# Patient Record
Sex: Male | Born: 1970 | Race: White | Hispanic: No | Marital: Married | Smoking: Never smoker
Health system: Southern US, Community
[De-identification: ages and names within clinical notes are randomized; demographics above are authoritative.]

## PROBLEM LIST (undated history)

## (undated) DIAGNOSIS — K8042 Calculus of bile duct with acute cholecystitis without obstruction: Secondary | ICD-10-CM

## (undated) DIAGNOSIS — R51 Headache: Secondary | ICD-10-CM

## (undated) DIAGNOSIS — K219 Gastro-esophageal reflux disease without esophagitis: Secondary | ICD-10-CM

## (undated) DIAGNOSIS — K859 Acute pancreatitis without necrosis or infection, unspecified: Secondary | ICD-10-CM

## (undated) DIAGNOSIS — G5620 Lesion of ulnar nerve, unspecified upper limb: Secondary | ICD-10-CM

## (undated) DIAGNOSIS — N200 Calculus of kidney: Secondary | ICD-10-CM

## (undated) DIAGNOSIS — R519 Headache, unspecified: Secondary | ICD-10-CM

## (undated) DIAGNOSIS — J45909 Unspecified asthma, uncomplicated: Secondary | ICD-10-CM

## (undated) DIAGNOSIS — K449 Diaphragmatic hernia without obstruction or gangrene: Secondary | ICD-10-CM

## (undated) HISTORY — PX: TONSILLECTOMY: SUR1361

## (undated) HISTORY — PX: HYDROCELE EXCISION: SHX482

## (undated) HISTORY — DX: Lesion of ulnar nerve, unspecified upper limb: G56.20

## (undated) HISTORY — DX: Calculus of bile duct with acute cholecystitis without obstruction: K80.42

## (undated) HISTORY — DX: Calculus of kidney: N20.0

## (undated) HISTORY — DX: Acute pancreatitis without necrosis or infection, unspecified: K85.90

## (undated) HISTORY — PX: INGUINAL HERNIA REPAIR: SUR1180

## (undated) SURGERY — LAPAROSCOPIC CHOLECYSTECTOMY
Anesthesia: General

---

## 2005-02-17 ENCOUNTER — Ambulatory Visit: Payer: Self-pay | Admitting: Internal Medicine

## 2005-03-01 ENCOUNTER — Ambulatory Visit: Payer: Self-pay | Admitting: Specialist

## 2007-06-19 ENCOUNTER — Ambulatory Visit: Payer: Self-pay | Admitting: Family Medicine

## 2007-06-19 DIAGNOSIS — J309 Allergic rhinitis, unspecified: Secondary | ICD-10-CM | POA: Insufficient documentation

## 2007-06-19 DIAGNOSIS — W57XXXA Bitten or stung by nonvenomous insect and other nonvenomous arthropods, initial encounter: Secondary | ICD-10-CM

## 2007-06-19 DIAGNOSIS — S30860A Insect bite (nonvenomous) of lower back and pelvis, initial encounter: Secondary | ICD-10-CM | POA: Insufficient documentation

## 2007-12-03 ENCOUNTER — Telehealth: Payer: Self-pay | Admitting: Internal Medicine

## 2008-06-10 ENCOUNTER — Telehealth: Payer: Self-pay | Admitting: Internal Medicine

## 2008-11-11 ENCOUNTER — Ambulatory Visit: Payer: Self-pay | Admitting: Family Medicine

## 2008-12-05 ENCOUNTER — Ambulatory Visit: Payer: Self-pay | Admitting: Unknown Physician Specialty

## 2009-08-21 ENCOUNTER — Ambulatory Visit: Payer: Self-pay | Admitting: Internal Medicine

## 2009-08-21 DIAGNOSIS — G5620 Lesion of ulnar nerve, unspecified upper limb: Secondary | ICD-10-CM

## 2009-08-21 DIAGNOSIS — R5383 Other fatigue: Secondary | ICD-10-CM

## 2009-08-21 DIAGNOSIS — R079 Chest pain, unspecified: Secondary | ICD-10-CM | POA: Insufficient documentation

## 2009-08-21 DIAGNOSIS — R5381 Other malaise: Secondary | ICD-10-CM | POA: Insufficient documentation

## 2009-08-21 HISTORY — DX: Lesion of ulnar nerve, unspecified upper limb: G56.20

## 2009-08-26 LAB — CONVERTED CEMR LAB
ALT: 64 units/L — ABNORMAL HIGH (ref 0–53)
AST: 40 units/L — ABNORMAL HIGH (ref 0–37)
Albumin: 4.7 g/dL (ref 3.5–5.2)
Alkaline Phosphatase: 102 units/L (ref 39–117)
BUN: 13 mg/dL (ref 6–23)
Basophils Absolute: 0 10*3/uL (ref 0.0–0.1)
Basophils Relative: 0 % (ref 0–1)
CO2: 26 meq/L (ref 19–32)
Calcium: 9.6 mg/dL (ref 8.4–10.5)
Chloride: 100 meq/L (ref 96–112)
Creatinine, Ser: 0.96 mg/dL (ref 0.40–1.50)
Eosinophils Absolute: 0.1 10*3/uL (ref 0.0–0.7)
Eosinophils Relative: 1 % (ref 0–5)
Glucose, Bld: 97 mg/dL (ref 70–99)
HCT: 43 % (ref 39.0–52.0)
Hemoglobin: 14.7 g/dL (ref 13.0–17.0)
Lymphocytes Relative: 25 % (ref 12–46)
Lymphs Abs: 2 10*3/uL (ref 0.7–4.0)
MCHC: 34.2 g/dL (ref 30.0–36.0)
MCV: 90.9 fL (ref 78.0–100.0)
Monocytes Absolute: 0.6 10*3/uL (ref 0.1–1.0)
Monocytes Relative: 8 % (ref 3–12)
Neutro Abs: 5.5 10*3/uL (ref 1.7–7.7)
Neutrophils Relative %: 67 % (ref 43–77)
Platelets: 206 10*3/uL (ref 150–400)
Potassium: 3.9 meq/L (ref 3.5–5.3)
RBC: 4.73 M/uL (ref 4.22–5.81)
RDW: 12 % (ref 11.5–15.5)
Sodium: 141 meq/L (ref 135–145)
TSH: 1.341 microintl units/mL (ref 0.350–4.500)
Total Bilirubin: 0.4 mg/dL (ref 0.3–1.2)
Total Protein: 7.5 g/dL (ref 6.0–8.3)
Vit D, 25-Hydroxy: 37 ng/mL (ref 30–89)
WBC: 8.2 10*3/uL (ref 4.0–10.5)

## 2010-03-16 NOTE — Assessment & Plan Note (Signed)
Summary: ? LYMES DISEASE   Vital Signs:  Patient profile:   40 year old male Weight:      158 pounds Temp:     98.5 degrees F oral Pulse rate:   84 / minute Pulse rhythm:   regular BP sitting:   132 / 100  (right arm) Cuff size:   regular  Vitals Entered By: Lowella Petties CMA (August 21, 2009 3:10 PM) CC: Tingling left arm, ? lyme's disease   History of Present Illness: "I am feeling a littel pitiful"  Has had stiff neck, dull headache over the past 2 weeks Tingling numbness along left arm in ulnar distribution  vague chest discomfort gets intermittent tight feeling but no serious intensity  No known tick bite but outside a lot No fevers no rash  Allergies: 1)  ! * Advair  Past History:  Past medical, surgical, family and social histories (including risk factors) reviewed for relevance to current acute and chronic problems.  Past Medical History: Reviewed history from 06/20/2007 and no changes required. Unremarkable  Past Surgical History: Reviewed history from 06/20/2007 and no changes required. Atypical pneumonia (1990) Hydrocele (2001) Hernias as a baby  Family History: Reviewed history from 06/20/2007 and no changes required. Father okay Mother with Altzheimer's Siblings: 1 brother No CAD, HTN, DM Breast cancer in pat GGM Dennie Bible GGF died with laryngeal cancer No prostate or colon cancer  Social History: Reviewed history from 06/20/2007 and no changes required. Marital Status: Married Children: 1 son Occupation: Teacher, early years/pre Never Smoked Alcohol use-occ  Review of Systems       eating okay weight is stable no other joint issues  Physical Exam  General:  alert.  NAD Head:  normocephalic and atraumatic.   Neck:  supple, full ROM, no masses, no thyromegaly, and no cervical lymphadenopathy.   Lungs:  normal respiratory effort and normal breath sounds.   Heart:  normal rate, regular rhythm, no murmur, and no gallop.   Abdomen:  soft, non-tender,  no hepatomegaly, and no splenomegaly.   Msk:  no joint tenderness and no joint swelling.   Extremities:  no edema Neurologic:  alert & oriented X3, strength normal in all extremities, and gait normal.   Skin:  no rashes and no suspicious lesions.   Axillary Nodes:  No palpable lymphadenopathy Inguinal Nodes:  No significant adenopathy Psych:  normally interactive, good eye contact, not anxious appearing, and not depressed appearing.     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 Laboratory test [phone]) Venipuncture [phone]) Specimen Handling (66440)  Problem # 3:  CHEST PAIN (ICD-786.50) Assessment: New  very nonspecific  Orders: EKG w/ Interpretation (93000)  Complete Medication List: 1)  Zyrtec Allergy 10 Mg Tabs (Cetirizine hcl) .... Take 1 tablet by mouth once a day as needed 2)  Allegra 180 Mg Tabs (Fexofenadine hcl) .... Take 1 tablet by mouth once a day  as needed 3)  Dayquil Multi-symptom 30-325-10 Mg/44ml Liqd (Pseudoephedrine-apap-dm) .... As needed 4)  Elocon 0.1 % Crea (Mometasone furoate) .... Apply thinnly two times a day to rash 5)  Fluticasone Propionate 50 Mcg/act Susp (Fluticasone propionate) .... 2 sprays each  nostril daily 6)  Alprazolam 0.25 Mg Tabs (Alprazolam) .Marland Kitchen.. 1 to 2 tablets three times a day prn 7)  Ventolin Hfa 108 (90 Base) Mcg/act Aers (Albuterol sulfate) .... 2 puffs four times daily as needed for wheezing 8)  Astepro 0.15 % Soln (Azelastine hcl) .... 2 sprays two times a day fo rallergic rhinitis 9)  Prilosec 40 Mg Cpdr (Omeprazole) .... Take one  by mouth daily  Patient Instructions: 1)  Please schedule a follow-up appointment as needed .   Prior Medications (reviewed today): ZYRTEC ALLERGY 10 MG  TABS (CETIRIZINE HCL) Take 1 tablet by mouth once a day as needed ALLEGRA 180 MG  TABS (FEXOFENADINE HCL) Take 1 tablet by mouth once a day  as needed DAYQUIL MULTI-SYMPTOM 30-325-10 MG/15ML  LIQD (PSEUDOEPHEDRINE-APAP-DM) as needed ELOCON 0.1 %  CREA (MOMETASONE FUROATE) apply thinnly two times a day to rash FLUTICASONE PROPIONATE 50 MCG/ACT SUSP (FLUTICASONE PROPIONATE) 2 sprays each nostril daily ALPRAZOLAM 0.25 MG TABS (ALPRAZOLAM) 1 to 2 tablets three times a day prn VENTOLIN HFA 108 (90 BASE) MCG/ACT AERS (ALBUTEROL SULFATE) 2 puffs four times daily as needed for wheezing ASTEPRO 0.15 % SOLN (AZELASTINE HCL) 2 sprays two times a day fo rallergic rhinitis PRILOSEC 40 MG CPDR (OMEPRAZOLE) take one by mouth daily Current Allergies: ! * ADVAIR   EKG  Procedure date:  08/21/2009  Findings:      sinus @90  short PR but no delta waves otherwise normal

## 2011-07-19 ENCOUNTER — Other Ambulatory Visit: Payer: Self-pay | Admitting: *Deleted

## 2011-07-19 MED ORDER — ALBUTEROL SULFATE HFA 108 (90 BASE) MCG/ACT IN AERS: 2.0000 | INHALATION_SPRAY | Freq: Four times a day (QID) | RESPIRATORY_TRACT | Status: DC | PRN

## 2011-07-19 NOTE — Telephone Encounter (Signed)
rx sent to pharmacy by e-script .left message to have patient schedule CPX.

## 2011-07-19 NOTE — Telephone Encounter (Signed)
Okay #1 x 1 Let him know he should set up for a physical within the next year

## 2011-07-19 NOTE — Telephone Encounter (Signed)
rx not on med list, pt last seen 2011, no future appts scheduled, ok to fill?

## 2012-01-27 ENCOUNTER — Other Ambulatory Visit: Payer: Self-pay | Admitting: *Deleted

## 2012-01-27 MED ORDER — ALBUTEROL SULFATE HFA 108 (90 BASE) MCG/ACT IN AERS: 2.0000 | INHALATION_SPRAY | Freq: Four times a day (QID) | RESPIRATORY_TRACT | Status: DC | PRN

## 2012-01-27 NOTE — Telephone Encounter (Signed)
rx sent to pharmacy by e-script  

## 2012-01-27 NOTE — Telephone Encounter (Signed)
Okay to refill #1 x 1

## 2012-01-27 NOTE — Telephone Encounter (Signed)
Pt faxed request for his refill (pharmacist at [facility]) per fax he says he's not having any problems or concerns. He's just trying to use up flex spending money for the year and request refill to do so. Is it ok to refill, he does not have any future appointments scheduled.

## 2013-10-09 ENCOUNTER — Other Ambulatory Visit: Payer: Self-pay | Admitting: Internal Medicine

## 2013-10-09 NOTE — Telephone Encounter (Signed)
See note attached to refill request from the pharmacist

## 2013-10-10 MED ORDER — ALBUTEROL SULFATE 108 (90 BASE) MCG/ACT IN AEPB: 2.0000 | INHALATION_SPRAY | Freq: Four times a day (QID) | RESPIRATORY_TRACT | Status: DC | PRN

## 2013-10-10 NOTE — Telephone Encounter (Signed)
rx sent to pharmacy by e-script rx changed

## 2013-10-10 NOTE — Telephone Encounter (Signed)
Okay to try the respiclick with the coupons

## 2015-02-11 ENCOUNTER — Encounter: Payer: Self-pay | Admitting: Family Medicine

## 2015-02-11 ENCOUNTER — Ambulatory Visit (INDEPENDENT_AMBULATORY_CARE_PROVIDER_SITE_OTHER): Payer: Managed Care, Other (non HMO) | Admitting: Family Medicine

## 2015-02-11 VITALS — BP 120/80 | HR 99 | Temp 98.3°F | Ht 68.0 in | Wt 154.0 lb

## 2015-02-11 DIAGNOSIS — L729 Follicular cyst of the skin and subcutaneous tissue, unspecified: Secondary | ICD-10-CM | POA: Diagnosis not present

## 2015-02-11 DIAGNOSIS — L089 Local infection of the skin and subcutaneous tissue, unspecified: Secondary | ICD-10-CM | POA: Diagnosis not present

## 2015-02-11 MED ORDER — SULFAMETHOXAZOLE-TRIMETHOPRIM 800-160 MG PO TABS: 2.0000 | ORAL_TABLET | Freq: Two times a day (BID) | ORAL | Status: DC

## 2015-02-11 NOTE — Progress Notes (Signed)
Pre visit review using our clinic review tool, if applicable. No additional management support is needed unless otherwise documented below in the visit note. 

## 2015-02-11 NOTE — Progress Notes (Signed)
Subjective:    Patient ID: Warren David, male    DOB: 06/04/1970, 44 y.o.   MRN: [id number]  HPI Here with an ingrown hair/ infected on his neck   Worse since 12/25 Put bactroban on it  Started keflex - QID - 500 mg 2 days worth   Is draining - he popped it yesterday   No fever  Feels warm    Works in a pharmacy   Patient Active Problem List   Diagnosis Date Noted  . 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 FATIGUE 08/21/2009  . CHEST PAIN 08/21/2009  . ALLERGIC RHINITIS 06/19/2007  . INSECT BITE, TRUNK 06/19/2007   No past medical history on file. No past surgical history on file. Social History  Substance Use Topics  . Smoking status: Never Smoker   . Smokeless tobacco: None  . Alcohol Use: None   No family history on file. Allergies  Allergen Reactions  . Fluticasone-Salmeterol     REACTION: palpatations   Current Outpatient Prescriptions on File Prior to Visit  Medication Sig Dispense Refill  . Albuterol Sulfate (PROAIR RESPICLICK) 108 (90 BASE) MCG/ACT AEPB Inhale 2 puffs into the lungs 4 (four) times daily as needed. 1 each 1   No current facility-administered medications on file prior to visit.    Review of Systems Review of Systems  Constitutional: Negative for fever, appetite change, fatigue and unexpected weight change.  Eyes: Negative for pain and visual disturbance.  Respiratory: Negative for cough and shortness of breath.   Cardiovascular: Negative for cp or palpitations    Gastrointestinal: Negative for nausea, diarrhea and constipation.  Genitourinary: Negative for urgency and frequency.  Skin: Negative for pallor or rash  pos for infected cyst on face  Neurological: Negative for weakness, light-headedness, numbness and headaches.  Hematological: Negative for adenopathy. Does not bruise/bleed easily.  Psychiatric/Behavioral: Negative for dysphoric mood. The patient is not nervous/anxious.         Objective:   Physical Exam    Constitutional: He appears well-developed and well-nourished. No distress.  Well appearing   HENT:  Head: Normocephalic and atraumatic.  Right Ear: External ear normal.  Left Ear: External ear normal.  Nose: Nose normal.  Mouth/Throat: Oropharynx is clear and moist. No oropharyngeal exudate.  Eyes: Conjunctivae and EOM are normal. Pupils are equal, round, and reactive to light.  Neck: Normal range of motion. Neck supple.  Cardiovascular: Normal rate and regular rhythm.   Skin: Skin is warm and dry. There is erythema.  2 by 3 cm area of erythema and induration (firm) over R mandible area (in beard) Several small holes with clear d/c (spec taken for cx but unable to express pus) Mildly tender Warm to the touch  No streaking   Psychiatric: He has a normal mood and affect.          Assessment & Plan:   Problem List Items Addressed This Visit      Musculoskeletal and Integument   Infected cyst of skin - Primary    On R jaw-likely began as an ingrown hair Not imp after 2d of keflex Still to firm to I and D- but has had some spontaneous drainage at home  Wound cx sent from small amt of material expressed (clear) Concern for MRSA  Will begin bactrim 2 po bid for coverage Recommend warm compresses  Clean with antibacterial soap and water and cover lightly Update if not starting to improve in a week or if worsening

## 2015-02-11 NOTE — Patient Instructions (Signed)
Because keflex is not working - your infection may be MRSA Continue bactroban  Cover lightly  Take the bactrim as directed  Use warm compresses every chance you get  Encourage drainage but do not over manipulate it  Clean with antibacterial soap and water  If you develop red streaks or fever - alert us right away  Do not shave until this is completely better  I am sending a wound culture and we will update you with results

## 2015-02-12 NOTE — Assessment & Plan Note (Signed)
On R jaw-likely began as an ingrown hair Not imp after 2d of keflex Still to firm to I and D- but has had some spontaneous drainage at home  Wound cx sent from small amt of material expressed (clear) Concern for MRSA  Will begin bactrim 2 po bid for coverage Recommend warm compresses  Clean with antibacterial soap and water and cover lightly Update if not starting to improve in a week or if worsening

## 2015-02-14 LAB — WOUND CULTURE
GRAM STAIN: NONE SEEN
GRAM STAIN: NONE SEEN

## 2015-12-28 ENCOUNTER — Other Ambulatory Visit: Payer: Self-pay | Admitting: Unknown Physician Specialty

## 2015-12-28 DIAGNOSIS — R1310 Dysphagia, unspecified: Secondary | ICD-10-CM

## 2015-12-28 DIAGNOSIS — K219 Gastro-esophageal reflux disease without esophagitis: Secondary | ICD-10-CM

## 2016-01-01 ENCOUNTER — Ambulatory Visit
Admission: RE | Admit: 2016-01-01 | Discharge: 2016-01-01 | Disposition: A | Discharge status: Home / Self Care | Payer: PRIVATE HEALTH INSURANCE | Source: Ambulatory Visit | Attending: Unknown Physician Specialty | Admitting: Unknown Physician Specialty

## 2016-01-01 DIAGNOSIS — K219 Gastro-esophageal reflux disease without esophagitis: Secondary | ICD-10-CM | POA: Insufficient documentation

## 2016-01-01 DIAGNOSIS — R1310 Dysphagia, unspecified: Secondary | ICD-10-CM | POA: Insufficient documentation

## 2016-01-05 ENCOUNTER — Telehealth: Payer: Self-pay | Admitting: Gastroenterology

## 2016-01-05 NOTE — Telephone Encounter (Signed)
Spoke to patient. 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 Please call

## 2016-01-05 NOTE — Telephone Encounter (Signed)
Please get his insurance information and put it in.

## 2016-01-20 ENCOUNTER — Other Ambulatory Visit: Payer: Self-pay

## 2016-01-20 NOTE — Telephone Encounter (Signed)
Gastroenterology Pre-Procedure Review  Request Date: 02/18/2016 Requesting Physician:   PATIENT REVIEW QUESTIONS: The patient responded to the following health history questions as indicated:    1. Are you having any GI issues? no 2. Do you have a personal history of Polyps? no 3. Do you have a family history of Colon Cancer or Polyps? yes (aunt colon cancer) 4. Diabetes Mellitus? no 5. Joint replacements in the past 12 months?no 6. Major health problems in the past 3 months?no 7. Any artificial heart valves, MVP, or defibrillator?no    MEDICATIONS & ALLERGIES:    Patient reports the following regarding taking any anticoagulation/antiplatelet therapy:   Plavix, Coumadin, Eliquis, Xarelto, Lovenox, Pradaxa, Brilinta, or Effient? no Aspirin? no  Patient confirms/reports the following medications:  Current Outpatient Prescriptions  Medication Sig Dispense Refill  . Albuterol Sulfate (PROAIR RESPICLICK) 108 (90 BASE) MCG/ACT AEPB Inhale 2 puffs into the lungs 4 (four) times daily as needed. 1 each 1  . fluticasone (FLONASE) 50 MCG/ACT nasal spray Place into both nostrils daily.    Marland Kitchen. omeprazole (PRILOSEC) 40 MG capsule Take 40 mg by mouth daily.     No current facility-administered medications for this visit.     Patient confirms/reports the following allergies:  Allergies  Allergen Reactions  . Fluticasone-Salmeterol     REACTION: palpatations    No orders of the defined types were placed in this encounter.   AUTHORIZATION INFORMATION Primary Insurance: 1D#: Group #:  Secondary Insurance: 1D#: Group #:  SCHEDULE INFORMATION: Date: 02/18/2016 Time: Location: [facility] \

## 2016-01-20 NOTE — Telephone Encounter (Signed)
EGD GERD K21.9, [facility]13.14 [facility] Dr. Servando SnareWohl [employer] Call to pre cert and see if we are in net-work

## 2016-01-26 NOTE — Telephone Encounter (Signed)
We are in network, an authorization is not required

## 2016-01-29 ENCOUNTER — Other Ambulatory Visit: Payer: Self-pay

## 2016-02-18 ENCOUNTER — Ambulatory Visit: Admit: 2016-02-18 | Payer: Managed Care, Other (non HMO) | Admitting: Gastroenterology

## 2016-02-18 SURGERY — ESOPHAGOGASTRODUODENOSCOPY (EGD) WITH PROPOFOL
Anesthesia: General

## 2016-03-01 ENCOUNTER — Ambulatory Visit: Payer: No Typology Code available for payment source | Admitting: Registered Nurse

## 2016-03-01 ENCOUNTER — Ambulatory Visit
Admission: RE | Admit: 2016-03-01 | Discharge: 2016-03-01 | Disposition: A | Discharge status: Home / Self Care | Payer: No Typology Code available for payment source | Source: Ambulatory Visit | Attending: Gastroenterology | Admitting: Gastroenterology

## 2016-03-01 ENCOUNTER — Encounter
Admission: RE | Disposition: A | Discharge status: Home / Self Care | Payer: Self-pay | Source: Ambulatory Visit | Attending: Gastroenterology

## 2016-03-01 DIAGNOSIS — K449 Diaphragmatic hernia without obstruction or gangrene: Secondary | ICD-10-CM | POA: Insufficient documentation

## 2016-03-01 DIAGNOSIS — R1310 Dysphagia, unspecified: Secondary | ICD-10-CM

## 2016-03-01 DIAGNOSIS — K219 Gastro-esophageal reflux disease without esophagitis: Secondary | ICD-10-CM | POA: Diagnosis not present

## 2016-03-01 DIAGNOSIS — J45909 Unspecified asthma, uncomplicated: Secondary | ICD-10-CM | POA: Diagnosis not present

## 2016-03-01 HISTORY — PX: ESOPHAGOGASTRODUODENOSCOPY (EGD) WITH PROPOFOL: SHX5813

## 2016-03-01 SURGERY — ESOPHAGOGASTRODUODENOSCOPY (EGD) WITH PROPOFOL
Anesthesia: General

## 2016-03-01 MED ORDER — LIDOCAINE HCL (PF) 1 % IJ SOLN
INTRAMUSCULAR | Status: AC
  Administered 2016-03-01: 0.03 mL via INTRADERMAL
  Filled 2016-03-01: qty 2

## 2016-03-01 MED ORDER — LIDOCAINE HCL (PF) 2 % IJ SOLN
INTRAMUSCULAR | Status: DC | PRN
  Administered 2016-03-01: 80 mg via INTRADERMAL

## 2016-03-01 MED ORDER — SODIUM CHLORIDE 0.9 % IV SOLN
INTRAVENOUS | Status: DC
  Administered 2016-03-01: 1000 mL via INTRAVENOUS

## 2016-03-01 MED ORDER — FENTANYL CITRATE (PF) 100 MCG/2ML IJ SOLN
INTRAMUSCULAR | Status: DC | PRN
  Administered 2016-03-01: 150 ug via INTRAVENOUS

## 2016-03-01 MED ORDER — PROPOFOL 500 MG/50ML IV EMUL
INTRAVENOUS | Status: AC
  Filled 2016-03-01: qty 50

## 2016-03-01 MED ORDER — IPRATROPIUM-ALBUTEROL 0.5-2.5 (3) MG/3ML IN SOLN
3.0000 mL | Freq: Once | RESPIRATORY_TRACT | Status: AC
  Administered 2016-03-01: 3 mL via RESPIRATORY_TRACT

## 2016-03-01 MED ORDER — PROPOFOL 10 MG/ML IV BOLUS
INTRAVENOUS | Status: DC | PRN
  Administered 2016-03-01: 80 mg via INTRAVENOUS

## 2016-03-01 MED ORDER — LIDOCAINE HCL 2 % EX GEL
CUTANEOUS | Status: AC
  Filled 2016-03-01: qty 5

## 2016-03-01 MED ORDER — IPRATROPIUM-ALBUTEROL 0.5-2.5 (3) MG/3ML IN SOLN
RESPIRATORY_TRACT | Status: AC
  Administered 2016-03-01: 3 mL via RESPIRATORY_TRACT
  Filled 2016-03-01: qty 3

## 2016-03-01 MED ORDER — LIDOCAINE HCL (PF) 1 % IJ SOLN
2.0000 mL | Freq: Once | INTRAMUSCULAR | Status: AC
  Administered 2016-03-01: 0.03 mL via INTRADERMAL

## 2016-03-01 MED ORDER — PROPOFOL 500 MG/50ML IV EMUL
INTRAVENOUS | Status: DC | PRN
  Administered 2016-03-01: 150 ug/kg/min via INTRAVENOUS

## 2016-03-01 NOTE — H&P (Signed)
  Warren Miniumarren Dorotea Hand, MD [facility] [address] [place], [place] [address] Phone: [phone] Fax : [phone]  Primary Care Physician:  Tillman Abideichard Letvak, MD Primary Gastroenterologist:  Dr. Servando SnareWohl  Pre-Procedure History & Physical: HPI:  Warren David is a 46 y.o. male is here for an endoscopy.   No past medical history on file.  No past surgical history on file.  Prior to Admission medications   Medication Sig Start Date End Date Taking? Authorizing Provider  Albuterol Sulfate (PROAIR RESPICLICK) 108 (90 BASE) MCG/ACT AEPB Inhale 2 puffs into the lungs 4 (four) times daily as needed. 10/10/13   Karie Schwalbeichard I Letvak, MD  fluticasone (FLONASE) 50 MCG/ACT nasal spray Place into both nostrils daily.    Historical Provider, MD  omeprazole (PRILOSEC) 40 MG capsule Take 40 mg by mouth daily.    Historical Provider, MD    Allergies as of 01/29/2016 - Review Complete 01/20/2016  Allergen Reaction Noted  . Fluticasone-salmeterol      No family history on file.  Social History   Social History  . Marital status: Married    Spouse name: N/A  . Number of children: N/A  . Years of education: N/A   Occupational History  . Not on file.   Social History Main Topics  . Smoking status: Never Smoker  . Smokeless tobacco: Not on file  . Alcohol use Not on file  . Drug use: No  . Sexual activity: Not on file   Other Topics Concern  . Not on file   Social History Narrative  . No narrative on file    Review of Systems: See HPI, otherwise negative ROS  Physical Exam: BP 140/77   Pulse 95   Temp (!) 96.8 F (36 C) (Tympanic)   Resp 17   Ht 5\' 8"  (1.727 m)   Wt 155 lb (70.3 kg)   SpO2 99%   BMI 23.57 kg/m  General:   Alert,  pleasant and cooperative in NAD Head:  Normocephalic and atraumatic. Neck:  Supple; no masses or thyromegaly. Lungs:  Clear throughout to auscultation.    Heart:  Regular rate and rhythm. Abdomen:  Soft, nontender and nondistended. Normal bowel  sounds, without guarding, and without rebound.   Neurologic:  Alert and  oriented x4;  grossly normal neurologically.  Impression/Plan: Warren David is here for an endoscopy to be performed for dysphagia  Risks, benefits, limitations, and alternatives regarding  endoscopy have been reviewed with the patient.  Questions have been answered.  All parties agreeable.   Warren Miniumarren Areil Ottey, MD  03/01/2016, 7:40 AM

## 2016-03-01 NOTE — Anesthesia Postprocedure Evaluation (Signed)
Anesthesia Post Note  Patient: Warren David  Procedure(s) Performed: Procedure(s) (LRB): ESOPHAGOGASTRODUODENOSCOPY (EGD) WITH PROPOFOL (N/A)  Patient location during evaluation: Endoscopy Anesthesia Type: General Level of consciousness: awake and alert Pain management: pain level controlled Vital Signs Assessment: post-procedure vital signs reviewed and stable Respiratory status: spontaneous breathing, nonlabored ventilation, respiratory function stable and patient connected to nasal cannula oxygen Cardiovascular status: blood pressure returned to baseline and stable Postop Assessment: no signs of nausea or vomiting Anesthetic complications: no     Last Vitals:  Vitals:   03/01/16 0811 03/01/16 0820  BP: 102/68 114/75  Pulse: 91 91  Resp: 15 19  Temp: (!) 35.6 C     Last Pain:  Vitals:   03/01/16 0811  TempSrc: Tympanic                 Cleda MccreedyJoseph K Piscitello

## 2016-03-01 NOTE — Anesthesia Preprocedure Evaluation (Signed)
Anesthesia Evaluation  Patient identified by MRN, date of birth, ID band Patient awake    Reviewed: Allergy & Precautions, H&P , NPO status , Patient's Chart, lab work & pertinent test results  History of Anesthesia Complications Negative for: history of anesthetic complications  Airway Mallampati: III  TM Distance: <3 FB Neck ROM: full    Dental  (+) Poor Dentition, Chipped   Pulmonary neg shortness of breath, asthma ,    Pulmonary exam normal breath sounds clear to auscultation       Cardiovascular Exercise Tolerance: Good (-) angina(-) Past MI and (-) DOE negative cardio ROS Normal cardiovascular exam Rhythm:regular Rate:Normal     Neuro/Psych  Neuromuscular disease negative psych ROS   GI/Hepatic Neg liver ROS, GERD  Controlled and Medicated,  Endo/Other  negative endocrine ROS  Renal/GU negative Renal ROS  negative genitourinary   Musculoskeletal   Abdominal   Peds  Hematology negative hematology ROS (+)   Anesthesia Other Findings No past medical history on file.  No past surgical history on file.  BMI    Body Mass Index:  23.57 kg/m      Reproductive/Obstetrics negative OB ROS                             Anesthesia Physical Anesthesia Plan  ASA: III  Anesthesia Plan: General   Post-op Pain Management:    Induction:   Airway Management Planned:   Additional Equipment:   Intra-op Plan:   Post-operative Plan:   Informed Consent: I have reviewed the patients History and Physical, chart, labs and discussed the procedure including the risks, benefits and alternatives for the proposed anesthesia with the patient or authorized representative who has indicated his/her understanding and acceptance.   Dental Advisory Given  Plan Discussed with: Anesthesiologist, CRNA and Surgeon  Anesthesia Plan Comments:         Anesthesia Quick Evaluation

## 2016-03-01 NOTE — Transfer of Care (Signed)
Immediate Anesthesia Transfer of Care Note  Patient: Warren David IV  Procedure(s) Performed: Procedure(s): ESOPHAGOGASTRODUODENOSCOPY (EGD) WITH PROPOFOL (N/A)  Patient Location: PACU  Anesthesia Type:General  Level of Consciousness: awake, alert  and oriented  Airway & Oxygen Therapy: Patient Spontanous Breathing and Patient connected to nasal cannula oxygen  Post-op Assessment: Report given to RN and Post -op Vital signs reviewed and stable  Post vital signs: Reviewed and stable  Last Vitals:  Vitals:   03/01/16 0810 03/01/16 0811  BP: 102/68 102/68  Pulse: 90 91  Resp: 11 15  Temp: (!) 35.6 C (!) 35.6 C    Last Pain:  Vitals:   03/01/16 0811  TempSrc: Tympanic         Complications: No apparent anesthesia complications

## 2016-03-01 NOTE — Anesthesia Procedure Notes (Signed)
Date/Time: 03/01/2016 7:55 AM Performed by: Karoline CaldwellSTARR, Edwardine Deschepper Pre-anesthesia Checklist: Patient identified, Emergency Drugs available, Suction available and Patient being monitored Patient Re-evaluated:Patient Re-evaluated prior to inductionOxygen Delivery Method: Nasal cannula

## 2016-03-01 NOTE — Op Note (Signed)
[facility] Patient Name: Warren David Procedure Date: 03/01/2016 7:55 AM MRN: [id number] Account #: [id number] Date of Birth: 07/16/1970 Admit Type: Outpatient Age: 46 [facility] Gender: Male Note Status: Finalized Procedure:            Upper GI endoscopy Indications:          Dysphagia Providers:            Midge Miniumarren Sila Sarsfield MD, MD Referring MD:         Karie Schwalbeichard I. Letvak (Referring MD) Medicines:            Propofol per Anesthesia Complications:        No immediate complications. Procedure:            Pre-Anesthesia Assessment:                       - Prior to the procedure, a History and Physical was                        performed, and patient medications and allergies were                        reviewed. The patient's tolerance of previous                        anesthesia was also reviewed. The risks and benefits of                        the procedure and the sedation options and risks were                        discussed with the patient. All questions were                        answered, and informed consent was obtained. Prior                        Anticoagulants: The patient has taken no previous                        anticoagulant or antiplatelet agents. ASA Grade                        Assessment: II - A patient with mild systemic disease.                        After reviewing the risks and benefits, the patient was                        deemed in satisfactory condition to undergo the                        procedure.                       After obtaining informed consent, the endoscope was                        passed under direct vision. Throughout the procedure,  the patient's blood pressure, pulse, and oxygen                        saturations were monitored continuously. The                        Colonoscope was introduced through the mouth, and                        advanced to the second  part of duodenum. The upper GI                        endoscopy was accomplished without difficulty. The                        patient tolerated the procedure well. Findings:      A small hiatal hernia was present.      Two biopsies were obtained with cold forceps for histology randomly in       the middle third of the esophagus.      The stomach was normal.      The examined duodenum was normal. Impression:           - Small hiatal hernia.                       - Normal stomach.                       - Normal examined duodenum.                       - Two biopsies were obtained in the middle third of the                        esophagus. Recommendation:       - Discharge patient to home.                       - Resume previous diet.                       - Continue present medications.                       - Await pathology results. Procedure Code(s):    --- Professional ---                       [phone], Esophagogastroduodenoscopy, flexible, transoral;                        with biopsy, single or multiple Diagnosis Code(s):    --- Professional ---                       R13.10, Dysphagia, unspecified CPT copyright 2016 American Medical Association. All rights reserved. The codes documented in this report are preliminary and upon coder review may  be revised to meet current compliance requirements. Midge Minium MD, MD 03/01/2016 8:07:31 AM This report has been signed electronically. Number of Addenda: 0 Note Initiated On: 03/01/2016 7:55 AM      [facility]

## 2016-03-02 LAB — SURGICAL PATHOLOGY

## 2016-03-04 ENCOUNTER — Encounter: Payer: Self-pay | Admitting: Gastroenterology

## 2016-03-06 ENCOUNTER — Encounter: Payer: Self-pay | Admitting: Gastroenterology

## 2016-11-02 ENCOUNTER — Ambulatory Visit (INDEPENDENT_AMBULATORY_CARE_PROVIDER_SITE_OTHER): Payer: PRIVATE HEALTH INSURANCE | Admitting: Family Medicine

## 2016-11-02 ENCOUNTER — Encounter: Payer: Self-pay | Admitting: Family Medicine

## 2016-11-02 DIAGNOSIS — L0211 Cutaneous abscess of neck: Secondary | ICD-10-CM | POA: Diagnosis not present

## 2016-11-02 DIAGNOSIS — R69 Illness, unspecified: Secondary | ICD-10-CM

## 2016-11-02 DIAGNOSIS — J111 Influenza due to unidentified influenza virus with other respiratory manifestations: Secondary | ICD-10-CM

## 2016-11-02 MED ORDER — OSELTAMIVIR PHOSPHATE 75 MG PO CAPS: 75.0000 mg | ORAL_CAPSULE | Freq: Two times a day (BID) | ORAL | 0 refills | Status: DC

## 2016-11-02 MED ORDER — DOXYCYCLINE HYCLATE 100 MG PO TABS: 100.0000 mg | ORAL_TABLET | Freq: Two times a day (BID) | ORAL | 0 refills | Status: DC

## 2016-11-02 NOTE — Progress Notes (Signed)
Subjective:   Patient ID: Warren David, male    DOB: [birth year], 46 y.o.   MRN: [id number]  Warren David is a pleasant 46 y.o. year old male who presents to clinic today with Acute Visit (Pt is here today c/o that he fell on monday on his back and has body soreness, he has body chills and his body is still aching, and his neck is pain, he is unsure if he has the flu or body hurts from the fall, he did have a low grade temp last night, slight cough, dry throat )  on 11/02/2016  HPI:  Warren David is a 46 y.o. male who present complaining of flu-like symptoms: fevers, chills, myalgias, congestion, sore throat and cough for 1 day. Denies dyspnea or wheezing.   Of note, he did slip in the rain and fall on his his back on Monday so he is not sure if some of his myalgias are related to that.  Also has "ingrown hair" on right side of his neck that is now red, very tender and draining pus.  He has had these before.  Has chills, nausea. Did vomit last night.  Current Outpatient Prescriptions on File Prior to Visit  Medication Sig Dispense Refill  . Albuterol Sulfate (PROAIR RESPICLICK) 108 (90 BASE) MCG/ACT AEPB Inhale 2 puffs into the lungs 4 (four) times daily as needed. 1 each 1  . fluticasone (FLONASE) 50 MCG/ACT nasal spray Place into both nostrils daily.    Marland Kitchen omeprazole (PRILOSEC) 40 MG capsule Take 40 mg by mouth daily.     No current facility-administered medications on file prior to visit.     Allergies  Allergen Reactions  . Fluticasone-Salmeterol     REACTION: palpatations    No past medical history on file.  Past Surgical History:  Procedure Laterality Date  . ESOPHAGOGASTRODUODENOSCOPY (EGD) WITH PROPOFOL N/A 03/01/2016   Procedure: ESOPHAGOGASTRODUODENOSCOPY (EGD) WITH PROPOFOL;  Surgeon: Midge Minium, MD;  Location: [facility] ENDOSCOPY;  Service: Endoscopy;  Laterality: N/A;    No family history on file.  Social History   Social History    . Marital status: Married    Spouse name: N/A  . Number of children: N/A  . Years of education: N/A   Occupational History  . Not on file.   Social History Main Topics  . Smoking status: Never Smoker  . Smokeless tobacco: Never Used  . Alcohol use Not on file  . Drug use: No  . Sexual activity: Not on file   Other Topics Concern  . Not on file   Social History Narrative  . No narrative on file   The PMH, PSH, Social History, Family History, Medications, and allergies have been reviewed in [facility], and have been updated if relevant.   Review of Systems  Constitutional: Positive for chills, diaphoresis and fever.  HENT: Positive for sore throat. Negative for congestion.   Respiratory: Negative.   Cardiovascular: Negative.   Gastrointestinal: Negative.   Musculoskeletal: Positive for myalgias and neck pain.  Skin: Positive for wound.  All other systems reviewed and are negative.      Objective:    BP 122/84 (BP Location: Right Arm)   Pulse (!) 114   Temp 99.1 F (37.3 C) (Oral)   Ht  (1.727 m)   Wt 157 lb (71.2 kg)   SpO2 95%   BMI 23.87 kg/m    Physical Exam  Constitutional: He is oriented to person, place,  and time. He appears well-developed and well-nourished.  Shivering under a blanket  HENT:  Head: Normocephalic.  Cardiovascular: Tachycardia present.   Pulmonary/Chest: Effort normal and breath sounds normal.  Neurological: He is alert and oriented to person, place, and time. No cranial nerve deficit.  Skin:     Psychiatric: He has a normal mood and affect. His behavior is normal. Judgment and thought content normal.  Nursing note and vitals reviewed.         Assessment & Plan:   Neck abscess No Follow-up on file.

## 2016-11-02 NOTE — Assessment & Plan Note (Signed)
Will treat with oral doxycyline 100 mg twice daily x 7 days.

## 2016-11-02 NOTE — Assessment & Plan Note (Signed)
Flu swab neg- has all classic signs and symptoms of possible flu. Discussed with pt at length- he will start doxycyline first to see how he feels and tolerates this. eRx also sent in for tamiflu. Call or return to clinic prn if these symptoms worsen or fail to improve as anticipated.

## 2017-03-28 ENCOUNTER — Inpatient Hospital Stay: Payer: Self-pay

## 2017-03-28 ENCOUNTER — Emergency Department: Payer: Self-pay

## 2017-03-28 ENCOUNTER — Other Ambulatory Visit: Payer: Self-pay

## 2017-03-28 ENCOUNTER — Inpatient Hospital Stay
Admission: EM | Admit: 2017-03-28 | Discharge: 2017-04-05 | DRG: 439 | Disposition: A | Discharge status: Home / Self Care | Payer: Self-pay | Attending: Internal Medicine | Admitting: Internal Medicine

## 2017-03-28 DIAGNOSIS — K567 Ileus, unspecified: Secondary | ICD-10-CM | POA: Diagnosis present

## 2017-03-28 DIAGNOSIS — K851 Biliary acute pancreatitis without necrosis or infection: Principal | ICD-10-CM

## 2017-03-28 DIAGNOSIS — K8042 Calculus of bile duct with acute cholecystitis without obstruction: Secondary | ICD-10-CM

## 2017-03-28 DIAGNOSIS — J4520 Mild intermittent asthma, uncomplicated: Secondary | ICD-10-CM | POA: Diagnosis present

## 2017-03-28 DIAGNOSIS — R7989 Other specified abnormal findings of blood chemistry: Secondary | ICD-10-CM

## 2017-03-28 DIAGNOSIS — Z820 Family history of epilepsy and other diseases of the nervous system: Secondary | ICD-10-CM

## 2017-03-28 DIAGNOSIS — Z79899 Other long term (current) drug therapy: Secondary | ICD-10-CM

## 2017-03-28 DIAGNOSIS — R188 Other ascites: Secondary | ICD-10-CM | POA: Diagnosis present

## 2017-03-28 DIAGNOSIS — K859 Acute pancreatitis without necrosis or infection, unspecified: Secondary | ICD-10-CM

## 2017-03-28 DIAGNOSIS — R945 Abnormal results of liver function studies: Secondary | ICD-10-CM

## 2017-03-28 DIAGNOSIS — Z888 Allergy status to other drugs, medicaments and biological substances status: Secondary | ICD-10-CM

## 2017-03-28 DIAGNOSIS — K760 Fatty (change of) liver, not elsewhere classified: Secondary | ICD-10-CM | POA: Diagnosis present

## 2017-03-28 DIAGNOSIS — E876 Hypokalemia: Secondary | ICD-10-CM | POA: Diagnosis present

## 2017-03-28 DIAGNOSIS — R109 Unspecified abdominal pain: Secondary | ICD-10-CM

## 2017-03-28 HISTORY — DX: Unspecified asthma, uncomplicated: J45.909

## 2017-03-28 HISTORY — DX: Diaphragmatic hernia without obstruction or gangrene: K44.9

## 2017-03-28 HISTORY — DX: Acute pancreatitis without necrosis or infection, unspecified: K85.90

## 2017-03-28 LAB — COMPREHENSIVE METABOLIC PANEL
ALBUMIN: 4.6 g/dL (ref 3.5–5.0)
ALK PHOS: 121 U/L (ref 38–126)
ALT: 178 U/L — AB (ref 17–63)
AST: 331 U/L — AB (ref 15–41)
Anion gap: 17 — ABNORMAL HIGH (ref 5–15)
BUN: 14 mg/dL (ref 6–20)
CALCIUM: 9.6 mg/dL (ref 8.9–10.3)
CO2: 18 mmol/L — ABNORMAL LOW (ref 22–32)
Chloride: 103 mmol/L (ref 101–111)
Creatinine, Ser: 1.23 mg/dL (ref 0.61–1.24)
GFR calc Af Amer: >60 mL/min (ref >60)
GFR calc non Af Amer: >60 mL/min (ref >60)
GLUCOSE: 217 mg/dL — AB (ref 65–99)
Potassium: 3.3 mmol/L — ABNORMAL LOW (ref 3.5–5.1)
Sodium: 138 mmol/L (ref 135–145)
Total Bilirubin: 1.7 mg/dL — ABNORMAL HIGH (ref 0.3–1.2)
Total Protein: 7.9 g/dL (ref 6.5–8.1)

## 2017-03-28 LAB — MAGNESIUM: Magnesium: 1.9 mg/dL (ref 1.7–2.4)

## 2017-03-28 LAB — CBC
HCT: 50 % (ref 40.0–52.0)
Hemoglobin: 16.6 g/dL (ref 13.0–18.0)
MCH: 29.9 pg (ref 26.0–34.0)
MCHC: 33.1 g/dL (ref 32.0–36.0)
MCV: 90.5 fL (ref 80.0–100.0)
Platelets: 271 10*3/uL (ref 150–440)
RBC: 5.53 MIL/uL (ref 4.40–5.90)
RDW: 13 % (ref 11.5–14.5)
WBC: 18 10*3/uL — ABNORMAL HIGH (ref 3.8–10.6)

## 2017-03-28 LAB — URINALYSIS, COMPLETE (UACMP) WITH MICROSCOPIC
BACTERIA UA: NONE SEEN
BILIRUBIN URINE: NEGATIVE
Glucose, UA: 150 mg/dL — AB
Ketones, ur: 5 mg/dL — AB
Leukocytes, UA: NEGATIVE
NITRITE: NEGATIVE
PH: 6 (ref 5.0–8.0)
Protein, ur: NEGATIVE mg/dL
RBC / HPF: NONE SEEN RBC/hpf (ref 0–5)
SPECIFIC GRAVITY, URINE: 1.039 — AB (ref 1.005–1.030)
Squamous Epithelial / LPF: NONE SEEN

## 2017-03-28 LAB — LIPASE, BLOOD: Lipase: >10000 U/L — ABNORMAL HIGH (ref 11–51)

## 2017-03-28 MED ORDER — MORPHINE SULFATE (PF) 4 MG/ML IV SOLN
4.0000 mg | Freq: Once | INTRAVENOUS | Status: AC
  Administered 2017-03-28: 4 mg via INTRAVENOUS

## 2017-03-28 MED ORDER — ONDANSETRON HCL 4 MG/2ML IJ SOLN
4.0000 mg | Freq: Once | INTRAMUSCULAR | Status: AC
  Administered 2017-03-28: 4 mg via INTRAVENOUS
  Filled 2017-03-28: qty 2

## 2017-03-28 MED ORDER — BISACODYL 10 MG RE SUPP
10.0000 mg | Freq: Every day | RECTAL | Status: DC | PRN
Start: 2017-03-28 — End: 2017-04-05
  Administered 2017-04-01: 10 mg via RECTAL
  Filled 2017-03-28: qty 1

## 2017-03-28 MED ORDER — PIPERACILLIN-TAZOBACTAM 3.375 G IVPB
3.3750 g | Freq: Three times a day (TID) | INTRAVENOUS | Status: DC
  Administered 2017-03-28 – 2017-03-31 (×9): 3.375 g via INTRAVENOUS
  Filled 2017-03-28 (×9): qty 50

## 2017-03-28 MED ORDER — SENNOSIDES-DOCUSATE SODIUM 8.6-50 MG PO TABS: 1.0000 | ORAL_TABLET | Freq: Every evening | ORAL | Status: DC | PRN

## 2017-03-28 MED ORDER — IOPAMIDOL (ISOVUE-370) INJECTION 76%
100.0000 mL | Freq: Once | INTRAVENOUS | Status: AC | PRN
  Administered 2017-03-28: 100 mL via INTRAVENOUS

## 2017-03-28 MED ORDER — METOCLOPRAMIDE HCL 5 MG/ML IJ SOLN
12.5000 mg | Freq: Four times a day (QID) | INTRAVENOUS | Status: DC
  Filled 2017-03-28 (×4): qty 2.5

## 2017-03-28 MED ORDER — ONDANSETRON HCL 4 MG PO TABS
4.0000 mg | ORAL_TABLET | Freq: Four times a day (QID) | ORAL | Status: DC | PRN
  Filled 2017-03-28: qty 1

## 2017-03-28 MED ORDER — ACETAMINOPHEN 325 MG PO TABS: 650.0000 mg | ORAL_TABLET | Freq: Four times a day (QID) | ORAL | Status: DC | PRN

## 2017-03-28 MED ORDER — SODIUM CHLORIDE 0.9 % IV SOLN
INTRAVENOUS | Status: DC
  Administered 2017-03-28 – 2017-04-02 (×14): via INTRAVENOUS

## 2017-03-28 MED ORDER — SODIUM CHLORIDE 0.9 % IV SOLN
Freq: Once | INTRAVENOUS | Status: AC
  Administered 2017-03-28: 12:00:00 via INTRAVENOUS

## 2017-03-28 MED ORDER — ENOXAPARIN SODIUM 40 MG/0.4ML ~~LOC~~ SOLN
40.0000 mg | SUBCUTANEOUS | Status: DC
  Administered 2017-03-28 – 2017-04-04 (×8): 40 mg via SUBCUTANEOUS
  Filled 2017-03-28 (×8): qty 0.4

## 2017-03-28 MED ORDER — SODIUM CHLORIDE 0.9 % IV SOLN
INTRAVENOUS | Status: DC | PRN
  Administered 2017-03-28: 18:00:00 via INTRAVENOUS

## 2017-03-28 MED ORDER — MORPHINE SULFATE (PF) 2 MG/ML IV SOLN
2.0000 mg | INTRAVENOUS | Status: DC | PRN
  Administered 2017-03-28 – 2017-03-30 (×7): 2 mg via INTRAVENOUS
  Filled 2017-03-28 (×7): qty 1

## 2017-03-28 MED ORDER — FENTANYL CITRATE (PF) 100 MCG/2ML IJ SOLN
50.0000 ug | INTRAMUSCULAR | Status: DC | PRN
  Administered 2017-03-28 (×2): 50 ug via INTRAVENOUS
  Filled 2017-03-28 (×2): qty 2

## 2017-03-28 MED ORDER — ACETAMINOPHEN 650 MG RE SUPP: 650.0000 mg | Freq: Four times a day (QID) | RECTAL | Status: DC | PRN

## 2017-03-28 MED ORDER — PROMETHAZINE HCL 25 MG/ML IJ SOLN
25.0000 mg | Freq: Four times a day (QID) | INTRAMUSCULAR | Status: DC | PRN
  Administered 2017-03-28 – 2017-04-02 (×5): 25 mg via INTRAVENOUS
  Filled 2017-03-28 (×5): qty 1

## 2017-03-28 MED ORDER — ONDANSETRON HCL 4 MG/2ML IJ SOLN
INTRAMUSCULAR | Status: AC
  Filled 2017-03-28: qty 2

## 2017-03-28 MED ORDER — SODIUM CHLORIDE 0.9 % IV BOLUS (SEPSIS)
1000.0000 mL | Freq: Once | INTRAVENOUS | Status: AC
  Administered 2017-03-28: 1000 mL via INTRAVENOUS

## 2017-03-28 MED ORDER — ONDANSETRON HCL 4 MG/2ML IJ SOLN
INTRAMUSCULAR | Status: AC
  Administered 2017-03-28: 4 mg via INTRAVENOUS
  Filled 2017-03-28: qty 2

## 2017-03-28 MED ORDER — PANTOPRAZOLE SODIUM 40 MG IV SOLR
40.0000 mg | Freq: Every day | INTRAVENOUS | Status: DC
  Administered 2017-03-28 – 2017-03-31 (×4): 40 mg via INTRAVENOUS
  Filled 2017-03-28 (×4): qty 40

## 2017-03-28 MED ORDER — POTASSIUM CHLORIDE 10 MEQ/100ML IV SOLN
10.0000 meq | INTRAVENOUS | Status: AC
  Administered 2017-03-28 (×4): 10 meq via INTRAVENOUS
  Filled 2017-03-28 (×4): qty 100

## 2017-03-28 MED ORDER — PIPERACILLIN-TAZOBACTAM 3.375 G IVPB 30 MIN
3.3750 g | Freq: Once | INTRAVENOUS | Status: AC
  Administered 2017-03-28: 3.375 g via INTRAVENOUS
  Filled 2017-03-28: qty 50

## 2017-03-28 MED ORDER — HYDROCODONE-ACETAMINOPHEN 5-325 MG PO TABS
1.0000 | ORAL_TABLET | ORAL | Status: DC | PRN
  Administered 2017-03-28 – 2017-03-29 (×3): 2 via ORAL
  Administered 2017-03-29: 1 via ORAL
  Administered 2017-03-30 (×2): 2 via ORAL
  Administered 2017-03-30: 1 via ORAL
  Administered 2017-03-30 – 2017-04-02 (×14): 2 via ORAL
  Administered 2017-04-02 – 2017-04-03 (×3): 1 via ORAL
  Administered 2017-04-03: 2 via ORAL
  Administered 2017-04-03 – 2017-04-05 (×7): 1 via ORAL
  Filled 2017-03-28: qty 2
  Filled 2017-03-28 (×2): qty 1
  Filled 2017-03-28 (×2): qty 2
  Filled 2017-03-28 (×2): qty 1
  Filled 2017-03-28 (×4): qty 2
  Filled 2017-03-28: qty 1
  Filled 2017-03-28 (×4): qty 2
  Filled 2017-03-28 (×2): qty 1
  Filled 2017-03-28 (×3): qty 2
  Filled 2017-03-28: qty 1
  Filled 2017-03-28: qty 2
  Filled 2017-03-28: qty 1
  Filled 2017-03-28 (×2): qty 2
  Filled 2017-03-28: qty 1
  Filled 2017-03-28 (×7): qty 2
  Filled 2017-03-28: qty 1

## 2017-03-28 MED ORDER — FENTANYL CITRATE (PF) 100 MCG/2ML IJ SOLN
100.0000 ug | Freq: Once | INTRAMUSCULAR | Status: AC
  Administered 2017-03-28: 100 ug via INTRAVENOUS
  Filled 2017-03-28: qty 2

## 2017-03-28 MED ORDER — MORPHINE SULFATE (PF) 4 MG/ML IV SOLN
INTRAVENOUS | Status: AC
  Administered 2017-03-28: 4 mg via INTRAVENOUS
  Filled 2017-03-28: qty 1

## 2017-03-28 MED ORDER — ONDANSETRON HCL 4 MG/2ML IJ SOLN
4.0000 mg | Freq: Four times a day (QID) | INTRAMUSCULAR | Status: DC | PRN
  Administered 2017-03-28 – 2017-04-05 (×7): 4 mg via INTRAVENOUS
  Filled 2017-03-28 (×7): qty 2

## 2017-03-28 NOTE — ED Triage Notes (Addendum)
Pt c/o epigastric pain with N/V,pt is pale and clammy in triage.. States sx started last night. Denies diarrhea.. Unable to obtain oral temp at this time.

## 2017-03-28 NOTE — ED Notes (Signed)
First Nurse Note:  Patient complaining of abdominal pain since last PM, and hand numbness.  Color pale, moaning in WC.

## 2017-03-28 NOTE — ED Notes (Signed)
Admitting MD at bedside.

## 2017-03-28 NOTE — H&P (Signed)
[facility] [place] [facility]   PATIENT NAME: Warren David    MR#:  [id number]  DATE OF BIRTH:  11/05/1970  DATE OF ADMISSION:  03/28/2017  PRIMARY CARE PHYSICIAN: Karie SchwalbeLetvak, Richard I, MD   REQUESTING/REFERRING PHYSICIAN: dr Don Perkingveronese  CHIEF COMPLAINT:  Abdominal pain  HISTORY OF PRESENT ILLNESS:  Warren David  is a 47 y.o. male with a known history of hiatal hernia and asthma who presents today due to abdominal pain. Patient reports that he has had midepigastric abdominal pain radiating to his back since yesterday evening. He says his abdominal pain radiates to his back. He has nausea associated with this. He denies fever or chills. He drinks very occasional EtOH. He reports that eating makes it worse. Currently he has 3 out of 10 pain. He presents to the emergency room due to these complaints. CT scan shows acute pancreatitis. Ultrasound of the abdomen shows acute cholecystitis with gallstones. He has been started on Zosyn. ER physician has spoken with surgical services who is recommending that the hospitalist admit the patient for acute cholecystitis/acute pancreatitis.  PAST MEDICAL HISTORY:   Past Medical History:  Diagnosis Date  . Asthma   . Hiatal hernia     PAST SURGICAL HISTORY:   Past Surgical History:  Procedure Laterality Date  . ESOPHAGOGASTRODUODENOSCOPY (EGD) WITH PROPOFOL N/A 03/01/2016   Procedure: ESOPHAGOGASTRODUODENOSCOPY (EGD) WITH PROPOFOL;  Surgeon: Midge Miniumarren Wohl, MD;  Location: [facility] ENDOSCOPY;  Service: Endoscopy;  Laterality: N/A;  . HYDROCELE EXCISION    . INGUINAL HERNIA REPAIR      SOCIAL HISTORY:   Social History   Tobacco Use  . Smoking status: Never Smoker  . Smokeless tobacco: Never Used  Substance Use Topics  . Alcohol use: Yes    Alcohol/week: 0.0 oz    FAMILY HISTORY:  Family history of elevated LFTs  DRUG ALLERGIES:   Allergies  Allergen Reactions  . Fluticasone-Salmeterol     REACTION: palpatations     REVIEW OF SYSTEMS:   Review of Systems  Constitutional: Negative.  Negative for chills, fever and malaise/fatigue.  HENT: Negative.  Negative for ear discharge, ear pain, hearing loss, nosebleeds and sore throat.   Eyes: Negative.  Negative for blurred vision and pain.  Respiratory: Negative.  Negative for cough, hemoptysis, shortness of breath and wheezing.   Cardiovascular: Negative.  Negative for chest pain, palpitations and leg swelling.  Gastrointestinal: Positive for abdominal pain, nausea and vomiting. Negative for blood in stool and diarrhea.  Genitourinary: Negative.  Negative for dysuria.  Musculoskeletal: Negative.  Negative for back pain.  Skin: Negative.   Neurological: Negative for dizziness, tremors, speech change, focal weakness, seizures and headaches.  Endo/Heme/Allergies: Negative.  Does not bruise/bleed easily.  Psychiatric/Behavioral: Negative.  Negative for depression, hallucinations and suicidal ideas.    MEDICATIONS AT HOME:   Prior to Admission medications   Medication Sig Start Date End Date Taking? Authorizing Provider  Albuterol Sulfate (PROAIR RESPICLICK) 108 (90 BASE) MCG/ACT AEPB Inhale 2 puffs into the lungs 4 (four) times daily as needed. 10/10/13  Yes Karie SchwalbeLetvak, Richard I, MD  fluticasone (FLONASE) 50 MCG/ACT nasal spray Place into both nostrils daily.   Yes [provider]  loratadine (CLARITIN) 10 MG tablet Take 10 mg by mouth daily.   Yes [provider]  omeprazole (PRILOSEC) 40 MG capsule Take 40 mg by mouth daily.   Yes [provider]      VITAL SIGNS:  Blood pressure 131/82, pulse 93, resp. rate 20,  height 5\' 7"  (1.702 m), weight 71.7 kg (158 lb), SpO2 95 %.  PHYSICAL EXAMINATION:   Physical Exam  Constitutional: He is oriented to person, place, and time and well-developed, well-nourished, and in no distress. No distress.  HENT:  Head: Normocephalic.  Eyes: No scleral icterus.  Neck: Normal range of motion.  Neck supple. No JVD present. No tracheal deviation present.  Cardiovascular: Normal rate, regular rhythm and normal heart sounds. Exam reveals no gallop and no friction rub.  No murmur heard. Pulmonary/Chest: Effort normal and breath sounds normal. No respiratory distress. He has no wheezes. He has no rales. He exhibits no tenderness.  Abdominal: Soft. Bowel sounds are normal. He exhibits no distension and no mass. There is tenderness. There is no rebound and no guarding.  Musculoskeletal: Normal range of motion. He exhibits no edema.  Neurological: He is alert and oriented to person, place, and time.  Skin: Skin is warm. No rash noted. No erythema.  Psychiatric: Affect and judgment normal.      LABORATORY PANEL:   CBC Recent Labs  Lab 03/28/17 0838  WBC 18.0*  HGB 16.6  HCT 50.0  PLT 271   ------------------------------------------------------------------------------------------------------------------  Chemistries  Recent Labs  Lab 03/28/17 0838  NA 138  K 3.3*  CL 103  CO2 18*  GLUCOSE 217*  BUN 14  CREATININE 1.23  CALCIUM 9.6  AST 331*  ALT 178*  ALKPHOS 121  BILITOT 1.7*   ------------------------------------------------------------------------------------------------------------------  Cardiac Enzymes No results for input(s): TROPONINI in the last 168 hours. ------------------------------------------------------------------------------------------------------------------  RADIOLOGY:  Ct Angio Abd/pel W And/or Wo Contrast  Result Date: 03/28/2017 CLINICAL DATA:  Severe abdominal pain since yesterday EXAM: CTA ABDOMEN AND PELVIS wITHOUT AND WITH CONTRAST TECHNIQUE: Multidetector CT imaging of the abdomen and pelvis was performed using the standard protocol during bolus administration of intravenous contrast. Multiplanar reconstructed images and MIPs were obtained and reviewed to evaluate the vascular anatomy. CONTRAST:  ISOVUE-370 IOPAMIDOL (ISOVUE-370)  INJECTION 76% COMPARISON:  None. FINDINGS: VASCULAR Aorta: Nonaneurysmal and patent. Minimal infrarenal atherosclerotic calcification. Celiac: Patent.  Branch vessels patent. SMA: Patent Renals: 3 right renal arteries and 2 left renal arteries are patent. IMA: Patent Inflow: Bilateral common, internal, and external iliac arteries are patent. A tiny low-density linear flap at the origin of the left internal iliac artery probably simply reflects signal averaging artifact rather than a dissection. There are no associated atherosclerotic changes. Proximal Outflow: Grossly patent bilaterally. Veins: No evidence of DVT. Splenic vein is patent. Superior mesenteric vein is patent. Portal vein is patent. Review of the MIP images confirms the above findings. NON-VASCULAR Lower chest: Clear Hepatobiliary: Mild diffuse hepatic steatosis. Moderate diffuse gallbladder wall thickening. No obvious gallstones. Pancreas: There is fluid density and stranding surrounding the entire pancreatic gland compatible with acute pancreatitis. There is blurring of the fat planes in the head of the pancreas. The head and central body of the pancreas is somewhat heterogeneous. See image 20 of series 5. Although underlying mass cannot be excluded, these findings probably represent inflammatory change. Follow-up imaging is going to be recommended to ensure resolution of this finding. There is no evidence of pancreatic necrosis or pancreatic hemorrhage. No evidence of visceral artery aneurysm. Stranding also affects the descending duodenum. Spleen: Unremarkable Adrenals/Urinary Tract: 6 mm calculus in the mid right kidney. Left kidney and adrenal glands are unremarkable. Bladder is within normal limits. Stomach/Bowel: Small hiatal hernia. There is no evidence of small-bowel obstruction. Colon is decompressed and is without obvious mass. Terminal  ileum is within normal limits. Lymphatic: No abnormal retroperitoneal adenopathy. Reproductive:  Unremarkable prostate. Other: There is trace fluid layering in the pelvis. There is also trace free fluid about the spleen. Musculoskeletal: No vertebral compression deformity. IMPRESSION: VASCULAR Aortic Atherosclerosis (ICD10-I70.0). NON-VASCULAR Findings are compatible with acute pancreatitis. The pancreatic head and central body are heterogeneous. Underlying mass is not excluded although these findings most likely reflect inflammation. Follow-up imaging is recommended to ensure resolution of this finding. Right nephrolithiasis. Electronically Signed   By: Jolaine Click M.D.   On: 03/28/2017 09:34   US Abdomen Limited Ruq  Result Date: 03/28/2017 CLINICAL DATA:  Pancreatitis. EXAM: ULTRASOUND ABDOMEN LIMITED RIGHT UPPER QUADRANT COMPARISON:  CT scan dated 03/28/2017 FINDINGS: Gallbladder: There are numerous stones in the gallbladder. There is gallbladder wall thickening with pericholecystic fluid. Negative sonographic Murphy's sign. Common bile duct: Diameter: 6 mm. No discrete stones in the bile duct. No dilated intrahepatic bile ducts. Liver: No focal lesion identified. Slight diffuse increased echogenicity of the parenchyma suggesting mild hepatic steatosis. Portal vein is patent on color Doppler imaging with normal direction of blood flow towards the liver. IMPRESSION: 1. Cholelithiasis with thickened gallbladder wall and pericholecystic fluid suggestive of cholecystitis. 2. Slight prominence of the common bile duct without a discrete common bile duct stone. 3. Mild hepatic steatosis. Electronically Signed   By: Francene Boyers M.D.   On: 03/28/2017 10:51    EKG:   Sinus tachycardia heart rate 101 no ST elevation Diffuse T-wave flattening  IMPRESSION AND PLAN:    47 year old male with a history of mild intermittent asthma who presents with abdominal pain.   1. Sepsis: Patient presents with leukocytosis, tachycardia, tachypnea sepsis is due to acute cholecystitis from gallstone  pancreatitis Continue Zosyn Continue IV fluids  2. Acute gallstone pancreatitis with acute cholecystitis and elevated liver function tests: GI consultation requested as well as surgery consultation. Patient will need to undergo ERCP and eventually cholecystectomy Nothing by mouth with IV fluids at 1 75 cc an hour When necessary pain control and antiemetics Continue Zosyn for acute cholecystitis Follow liver function test for a.m. (patient reports that he has a history of elevated liver function tests as well as a familial history of elevated LFTs.)  3. Mild intermittent asthma without signs of exacerbation 4. Hypokalemia: Replete     All the records are reviewed and case discussed with ED provider. Management plans discussed with the patient and wife and they are in agreement  CODE STATUS: full  TOTAL TIME TAKING CARE OF THIS PATIENT: 42 minutes.    Jahzara Slattery M.D on 03/28/2017 at 11:03 AM  Between 7am to 6pm - Pager - [phone]  After 6pm go to [email or web address] - Social research officer, government  [facility]  Office  [phone]  CC: Primary care physician; Karie Schwalbe, MD

## 2017-03-28 NOTE — Consult Note (Signed)
Date of Consultation:  03/28/2017  Requesting Physician:  Nita Sickle, MD  Reason for Consultation:  Gallstone pancreatitis  History of Present Illness: Warren David is a 47 y.o. male who presents with a one day history of abdominal pain.  Patient reports that yesterday he had [place] food for dinner and milk before bedtime and then around bedtime he started having cramping in the epigastric area.  He was able to go to sleep and then this morning he woke up having significantly worse abdominal pain in the epigastric area associated with nausea.  He did have 3 episodes of emesis at home which were nonbloody.  Denies having any fevers or chills.  Does report that his pain radiates towards his back.  He presented to the emergency room this morning for further evaluation and appeared to be clammy with concerns for possible dissection.  He did have a CTA abdomen pelvis which showed pretty significant pancreatitis.  He had an ultrasound which is concerning for cholecystitis.  His LFTs are elevated and his lipase is still pending 3 hours after specimen collection.  He did try taking Pepto-Bismol and baking soda when his cramping first started but this did not help.  Past Medical History: Past Medical History:  Diagnosis Date  . Asthma   . Hiatal hernia      Past Surgical History: Past Surgical History:  Procedure Laterality Date  . ESOPHAGOGASTRODUODENOSCOPY (EGD) WITH PROPOFOL N/A 03/01/2016   Procedure: ESOPHAGOGASTRODUODENOSCOPY (EGD) WITH PROPOFOL;  Surgeon: Midge Minium, MD;  Location: [facility] ENDOSCOPY;  Service: Endoscopy;  Laterality: N/A;  . HYDROCELE EXCISION    . INGUINAL HERNIA REPAIR      Home Medications: Prior to Admission medications   Medication Sig Start Date End Date Taking? Authorizing Provider  Albuterol Sulfate (PROAIR RESPICLICK) 108 (90 BASE) MCG/ACT AEPB Inhale 2 puffs into the lungs 4 (four) times daily as needed. 10/10/13  Yes Karie Schwalbe, MD   fluticasone (FLONASE) 50 MCG/ACT nasal spray Place into both nostrils daily.   Yes [provider]  loratadine (CLARITIN) 10 MG tablet Take 10 mg by mouth daily.   Yes [provider]  omeprazole (PRILOSEC) 40 MG capsule Take 40 mg by mouth daily.   Yes [provider]    Allergies: Allergies  Allergen Reactions  . Fluticasone-Salmeterol     REACTION: palpatations    Social History:  reports that  has never smoked. he has never used smokeless tobacco. He reports that he drinks alcohol. He reports that he does not use drugs.   Family History: Mother with Alzheimer's, no history of coronary artery disease, hypertension, or diabetes.  No prostate or colon cancer.  Review of Systems: Review of Systems  Constitutional: Negative for chills and fever.  HENT: Negative for hearing loss.   Respiratory: Negative for shortness of breath.   Cardiovascular: Negative for chest pain.  Gastrointestinal: Positive for abdominal pain, nausea and vomiting. Negative for constipation and diarrhea.  Genitourinary: Negative for dysuria.  Musculoskeletal: Positive for back pain.  Skin: Negative for rash.  Neurological: Negative for dizziness.  Psychiatric/Behavioral: Negative for depression.    Physical Exam BP 132/90   Pulse (!) 112   Resp 20   Ht 5\' 7"  (1.702 m)   Wt 71.7 kg (158 lb)   SpO2 92%   BMI 24.75 kg/m  CONSTITUTIONAL: No acute distress HEENT:  Normocephalic, atraumatic, extraocular motion intact. NECK: Trachea is midline, and there is no jugular venous distension. RESPIRATORY:  Lungs are clear,  and breath sounds are equal bilaterally. Normal respiratory effort without pathologic use of accessory muscles. CARDIOVASCULAR: Heart is regular without murmurs, gallops, or rubs. GI: The abdomen is soft, nondistended, with tenderness to palpation in the epigastric area as well as mildly diffuse towards the low abdomen.  No peritoneal signs. There were no palpable  masses.  Right inguinal hernia scar well-healed.   MUSCULOSKELETAL:  Normal muscle strength and tone in all four extremities.  No peripheral edema or cyanosis. SKIN: Skin turgor is normal. There are no pathologic skin lesions.  NEUROLOGIC:  Motor and sensation is grossly normal.  Cranial nerves are grossly intact. PSYCH:  Alert and oriented to person, place and time. Affect is normal.  Laboratory Analysis: Results for orders placed or performed during the hospital encounter of 03/28/17 (from the past 24 hour(s))  Comprehensive metabolic panel     Status: Abnormal   Collection Time: 03/28/17  8:38 AM  Result Value Ref Range   Sodium 138 135 - 145 mmol/L   Potassium 3.3 (L) 3.5 - 5.1 mmol/L   Chloride 103 101 - 111 mmol/L   CO2 18 (L) 22 - 32 mmol/L   Glucose, Bld 217 (H) 65 - 99 mg/dL   BUN 14 6 - 20 mg/dL   Creatinine, Ser 1.611.23 0.61 - 1.24 mg/dL   Calcium 9.6 8.9 - 09.610.3 mg/dL   Total Protein 7.9 6.5 - 8.1 g/dL   Albumin 4.6 3.5 - 5.0 g/dL   AST 045331 (H) 15 - 41 U/L   ALT 178 (H) 17 - 63 U/L   Alkaline Phosphatase 121 38 - 126 U/L   Total Bilirubin 1.7 (H) 0.3 - 1.2 mg/dL   GFR calc non Af Amer >60 >60 mL/min   GFR calc Af Amer >60 >60 mL/min   Anion gap 17 (H) 5 - 15  CBC     Status: Abnormal   Collection Time: 03/28/17  8:38 AM  Result Value Ref Range   WBC 18.0 (H) 3.8 - 10.6 K/uL   RBC 5.53 4.40 - 5.90 MIL/uL   Hemoglobin 16.6 13.0 - 18.0 g/dL   HCT 40.950.0 81.140.0 - 91.452.0 %   MCV 90.5 80.0 - 100.0 fL   MCH 29.9 26.0 - 34.0 pg   MCHC 33.1 32.0 - 36.0 g/dL   RDW 78.213.0 95.611.5 - 21.314.5 %   Platelets 271 150 - 440 K/uL  Urinalysis, Complete w Microscopic     Status: Abnormal   Collection Time: 03/28/17 10:39 AM  Result Value Ref Range   Color, Urine YELLOW (A) YELLOW   APPearance CLEAR (A) CLEAR   Specific Gravity, Urine 1.039 (H) 1.005 - 1.030   pH 6.0 5.0 - 8.0   Glucose, UA 150 (A) NEGATIVE mg/dL   Hgb urine dipstick SMALL (A) NEGATIVE   Bilirubin Urine NEGATIVE NEGATIVE    Ketones, ur 5 (A) NEGATIVE mg/dL   Protein, ur NEGATIVE NEGATIVE mg/dL   Nitrite NEGATIVE NEGATIVE   Leukocytes, UA NEGATIVE NEGATIVE   RBC / HPF NONE SEEN 0 - 5 RBC/hpf   WBC, UA 0-5 0 - 5 WBC/hpf   Bacteria, UA NONE SEEN NONE SEEN   Squamous Epithelial / LPF NONE SEEN NONE SEEN    Imaging: Ct Angio Abd/pel W And/or Wo Contrast  Result Date: 03/28/2017 CLINICAL DATA:  Severe abdominal pain since yesterday EXAM: CTA ABDOMEN AND PELVIS wITHOUT AND WITH CONTRAST TECHNIQUE: Multidetector CT imaging of the abdomen and pelvis was performed using the standard protocol during bolus administration of  intravenous contrast. Multiplanar reconstructed images and MIPs were obtained and reviewed to evaluate the vascular anatomy. CONTRAST:  ISOVUE-370 IOPAMIDOL (ISOVUE-370) INJECTION 76% COMPARISON:  None. FINDINGS: VASCULAR Aorta: Nonaneurysmal and patent. Minimal infrarenal atherosclerotic calcification. Celiac: Patent.  Branch vessels patent. SMA: Patent Renals: 3 right renal arteries and 2 left renal arteries are patent. IMA: Patent Inflow: Bilateral common, internal, and external iliac arteries are patent. A tiny low-density linear flap at the origin of the left internal iliac artery probably simply reflects signal averaging artifact rather than a dissection. There are no associated atherosclerotic changes. Proximal Outflow: Grossly patent bilaterally. Veins: No evidence of DVT. Splenic vein is patent. Superior mesenteric vein is patent. Portal vein is patent. Review of the MIP images confirms the above findings. NON-VASCULAR Lower chest: Clear Hepatobiliary: Mild diffuse hepatic steatosis. Moderate diffuse gallbladder wall thickening. No obvious gallstones. Pancreas: There is fluid density and stranding surrounding the entire pancreatic gland compatible with acute pancreatitis. There is blurring of the fat planes in the head of the pancreas. The head and central body of the pancreas is somewhat  heterogeneous. See image 20 of series 5. Although underlying mass cannot be excluded, these findings probably represent inflammatory change. Follow-up imaging is going to be recommended to ensure resolution of this finding. There is no evidence of pancreatic necrosis or pancreatic hemorrhage. No evidence of visceral artery aneurysm. Stranding also affects the descending duodenum. Spleen: Unremarkable Adrenals/Urinary Tract: 6 mm calculus in the mid right kidney. Left kidney and adrenal glands are unremarkable. Bladder is within normal limits. Stomach/Bowel: Small hiatal hernia. There is no evidence of small-bowel obstruction. Colon is decompressed and is without obvious mass. Terminal ileum is within normal limits. Lymphatic: No abnormal retroperitoneal adenopathy. Reproductive: Unremarkable prostate. Other: There is trace fluid layering in the pelvis. There is also trace free fluid about the spleen. Musculoskeletal: No vertebral compression deformity. IMPRESSION: VASCULAR Aortic Atherosclerosis (ICD10-I70.0). NON-VASCULAR Findings are compatible with acute pancreatitis. The pancreatic head and central body are heterogeneous. Underlying mass is not excluded although these findings most likely reflect inflammation. Follow-up imaging is recommended to ensure resolution of this finding. Right nephrolithiasis. Electronically Signed   By: Jolaine Click M.D.   On: 03/28/2017 09:34   US Abdomen Limited Ruq  Result Date: 03/28/2017 CLINICAL DATA:  Pancreatitis. EXAM: ULTRASOUND ABDOMEN LIMITED RIGHT UPPER QUADRANT COMPARISON:  CT scan dated 03/28/2017 FINDINGS: Gallbladder: There are numerous stones in the gallbladder. There is gallbladder wall thickening with pericholecystic fluid. Negative sonographic Murphy's sign. Common bile duct: Diameter: 6 mm. No discrete stones in the bile duct. No dilated intrahepatic bile ducts. Liver: No focal lesion identified. Slight diffuse increased echogenicity of the parenchyma  suggesting mild hepatic steatosis. Portal vein is patent on color Doppler imaging with normal direction of blood flow towards the liver. IMPRESSION: 1. Cholelithiasis with thickened gallbladder wall and pericholecystic fluid suggestive of cholecystitis. 2. Slight prominence of the common bile duct without a discrete common bile duct stone. 3. Mild hepatic steatosis. Electronically Signed   By: Francene Boyers M.D.   On: 03/28/2017 10:51    Assessment and Plan: This is a 47 y.o. male who presents with gallstone pancreatitis with pretty severe pancreatitis.  I have independently viewed the patient's imaging studies and reviewed his laboratory studies.  Overall he has very significant pancreatitis with surrounding inflammation and fluid.  His gallbladder does appear to be inflamed as well but unable to tell whether this is cholecystitis versus just spreading inflammation from his pancreas.  His  white blood cell count is elevated at 18, with total bilirubin of 1.7, AST 331, ALT 178, alkaline phosphatase 121.  Lipase is still pending.  Discussed with the patient that he has a significant episode of gallstone pancreatitis with quite a lot of inflammation and stranding around the pancreas as well as some fluid around it.  There is no discrete fluid collection yet.  But given the significant inflammation, at this point we would not be proceeding for cholecystectomy.  Given his elevated LFTs, agree with GI consultation to see whether he needs an ERCP versus MRCP first.  Whether he needs an ERCP or not, at this point we would not be proceeding with cholecystectomy.  This is something that will be done as an outpatient about 6-8 weeks from his symptoms resolve to allow for the inflammation to calm down.  In the meantime given his significant episode, would recommend keeping the patient n.p.o. with aggressive David fluid hydration.  He will need appropriate pain and nausea control.  Given that this point we cannot fully rule  out cholecystitis, but would be okay for the patient to have antibiotics for this.  Will follow along with you.  Patient understands this plan and all of his questions have been answered.   Howie Ill, MD [facility]

## 2017-03-28 NOTE — ED Notes (Signed)
Charge Nurse Heather aware of need for bed.  Room 15 being cleaned, will be ready in 5 min.

## 2017-03-28 NOTE — Progress Notes (Signed)
Pharmacy Antibiotic Note  Warren David is a 47 y.o. male admitted on 03/28/2017 with sepsis secondary to gallstone pancreatitis and potential cholecystitis .  Pharmacy has been consulted for Zosyn dosing.  Plan: Patient received Zosyn in the ED. Will continue Zosyn EI 3.375g VI Q8hr.    Height: 5\' 7"  (170.2 cm) Weight: 158 lb (71.7 kg) IBW/kg (Calculated) : 66.1  No data recorded.  Recent Labs  Lab 03/28/17 0838  WBC 18.0*  CREATININE 1.23    Estimated Creatinine Clearance: 69.4 mL/min (by C-G formula based on SCr of 1.23 mg/dL).    Allergies  Allergen Reactions  . Fluticasone-Salmeterol     REACTION: palpatations    Antimicrobials this admission: Zosyn 2/12 >>   Dose adjustments this admission: N/A  Microbiology results: N/A  Thank you for allowing pharmacy to be a part of this patient's care.  Simpson,Michael L 03/28/2017 12:01 PM

## 2017-03-28 NOTE — ED Provider Notes (Signed)
[facility] Emergency Department Provider Note  ____________________________________________  Time seen: Approximately 8:54 AM  David have reviewed the triage vital signs and the nursing notes.   HISTORY  Chief Complaint Abdominal Pain   HPI Warren David is a 47 y.o. male a history of a hiatal hernia who presents for evaluation of abdominal pain. Patient reports that the pain started yesterday evening, he describes the pain as the worst cramping his ever experienced, 10 out of 10, diffuse, worse in the center of his abdomen, radiating to his back. Today patient started to have nausea and vomiting and has had 3 episodes of nonbloody nonbilious emesis. Patient reports numbness of his bilateral hands and feet. Patient denies ever having similar pain. No fever or chills, no dysuria hematuria, no diarrhea or constipation. Patient has had inguinal hernia repair in the past but no other abdominal surgeries.  Past Medical History:  Diagnosis Date  . Asthma   . Hiatal hernia     Patient Active Problem List   Diagnosis Date Noted  . Pancreatitis 03/28/2017  . Neck abscess 11/02/2016  . Influenza-like illness 11/02/2016  . Problems with swallowing and mastication   . Infected cyst of skin 02/11/2015  . 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 FATIGUE 08/21/2009  . CHEST PAIN 08/21/2009  . ALLERGIC RHINITIS 06/19/2007  . INSECT BITE, TRUNK 06/19/2007    Past Surgical History:  Procedure Laterality Date  . ESOPHAGOGASTRODUODENOSCOPY (EGD) WITH PROPOFOL N/A 03/01/2016   Procedure: ESOPHAGOGASTRODUODENOSCOPY (EGD) WITH PROPOFOL;  Surgeon: Midge Miniumarren Wohl, Warren David;  Location: [facility] ENDOSCOPY;  Service: Endoscopy;  Laterality: N/A;  . HYDROCELE EXCISION    . INGUINAL HERNIA REPAIR      Prior to Admission medications   Medication Sig Start Date End Date Taking? Authorizing Provider  Albuterol Sulfate (PROAIR RESPICLICK) 108 (90 BASE) MCG/ACT AEPB Inhale 2 puffs into  the lungs 4 (four) times daily as needed. 10/10/13  Yes Karie SchwalbeLetvak, Warren David, Warren David  fluticasone (FLONASE) 50 MCG/ACT nasal spray Place into both nostrils daily.   Yes Provider, Historical, Warren David  loratadine (CLARITIN) 10 MG tablet Take 10 mg by mouth daily.   Yes Provider, Historical, Warren David  omeprazole (PRILOSEC) 40 MG capsule Take 40 mg by mouth daily.   Yes Provider, Historical, Warren David    Allergies Fluticasone-salmeterol  No family history on file.  Social History Social History   Tobacco Use  . Smoking status: Never Smoker  . Smokeless tobacco: Never Used  Substance Use Topics  . Alcohol use: Yes    Alcohol/week: 0.0 oz  . Drug use: No    Review of Systems  Constitutional: Negative for fever. Eyes: Negative for visual changes. ENT: Negative for sore throat. Neck: No neck pain  Cardiovascular: Negative for chest pain. Respiratory: Negative for shortness of breath. Gastrointestinal: + abdominal pain, nausea, and vomiting. No diarrhea. Genitourinary: Negative for dysuria. Musculoskeletal: Negative for back pain. Skin: Negative for rash. Neurological: Negative for headaches. + numbness of b/l hands and feet Psych: No SI or HI  ____________________________________________   PHYSICAL EXAM:  VITAL SIGNS: ED Triage Vitals  Enc Vitals Group     BP 03/28/17 0829 130/66     Pulse Rate 03/28/17 0829 (!) 118     Resp 03/28/17 0829 (!) 24     Temp --      Temp src --      SpO2 03/28/17 0829 100 %     Weight 03/28/17 0830 158 lb (71.7 kg)  Height 03/28/17 0830 5\' 7"  (1.702 m)     Head Circumference --      Peak Flow --      Pain Score 03/28/17 0830 5     Pain Loc --      Pain Edu? --      Excl. in GC? --     Constitutional: Alert and oriented, in obvious distress due to pain HEENT:      Head: Normocephalic and atraumatic.         Eyes: Conjunctivae are normal. Sclera is non-icteric.       Mouth/Throat: Mucous membranes are dry.       Neck: Supple with no signs of  meningismus. Cardiovascular: Tachycardic with regular rhythm. No murmurs, gallops, or rubs. 2+ symmetrical distal pulses are present in all extremities. No JVD. Respiratory: Normal respiratory effort. Lungs are clear to auscultation bilaterally. No wheezes, crackles, or rhonchi.  Gastrointestinal: Soft, diffusely tender to palpation with guarding. Musculoskeletal: Nontender with normal range of motion in all extremities. No edema, cyanosis, or erythema of extremities. Neurologic: Normal speech and language. Face is symmetric. Moving all extremities. No gross focal neurologic deficits are appreciated. Skin: Skin is warm, dry and intact. No rash noted. Psychiatric: Mood and affect are normal. Speech and behavior are normal.  ____________________________________________   LABS (all labs ordered are listed, but only abnormal results are displayed)  Labs Reviewed  COMPREHENSIVE METABOLIC PANEL - Abnormal; Notable for the following components:      Result Value   Potassium 3.3 (*)    CO2 18 (*)    Glucose, Bld 217 (*)    AST 331 (*)    ALT 178 (*)    Total Bilirubin 1.7 (*)    Anion gap 17 (*)    All other components within normal limits  CBC - Abnormal; Notable for the following components:   WBC 18.0 (*)    All other components within normal limits  URINALYSIS, COMPLETE (UACMP) WITH MICROSCOPIC - Abnormal; Notable for the following components:   Color, Urine YELLOW (*)    APPearance CLEAR (*)    Specific Gravity, Urine 1.039 (*)    Glucose, UA 150 (*)    Hgb urine dipstick SMALL (*)    Ketones, ur 5 (*)    All other components within normal limits  LIPASE, BLOOD   ____________________________________________  EKG  ED ECG REPORT David, Nita Sickle, the attending physician, personally viewed and interpreted this ECG.  Sinus tachycardia, rate of 101, normal intervals, normal axis, no ST elevations or depressions, diffuse T-wave flattening. Flattenings are new when compared to  prior from 2011 ____________________________________________  RADIOLOGY  Interpreted by me: CTA a/p: pancreatitis  RUQ [place]: Choledocholithiasis and cholecystitis   Interpretation by Radiologist:  Ct Angio Abd/pel W And/or Wo Contrast  Result Date: 03/28/2017 CLINICAL DATA:  Severe abdominal pain since yesterday EXAM: CTA ABDOMEN AND PELVIS wITHOUT AND WITH CONTRAST TECHNIQUE: Multidetector CT imaging of the abdomen and pelvis was performed using the standard protocol during bolus administration of intravenous contrast. Multiplanar reconstructed images and MIPs were obtained and reviewed to evaluate the vascular anatomy. CONTRAST:  ISOVUE-370 IOPAMIDOL (ISOVUE-370) INJECTION 76% COMPARISON:  None. FINDINGS: VASCULAR Aorta: Nonaneurysmal and patent. Minimal infrarenal atherosclerotic calcification. Celiac: Patent.  Branch vessels patent. SMA: Patent Renals: 3 right renal arteries and 2 left renal arteries are patent. IMA: Patent Inflow: Bilateral common, internal, and external iliac arteries are patent. A tiny low-density linear flap at the origin of the left internal  iliac artery probably simply reflects signal averaging artifact rather than a dissection. There are no associated atherosclerotic changes. Proximal Outflow: Grossly patent bilaterally. Veins: No evidence of DVT. Splenic vein is patent. Superior mesenteric vein is patent. Portal vein is patent. Review of the MIP images confirms the above findings. NON-VASCULAR Lower chest: Clear Hepatobiliary: Mild diffuse hepatic steatosis. Moderate diffuse gallbladder wall thickening. No obvious gallstones. Pancreas: There is fluid density and stranding surrounding the entire pancreatic gland compatible with acute pancreatitis. There is blurring of the fat planes in the head of the pancreas. The head and central body of the pancreas is somewhat heterogeneous. See image 20 of series 5. Although underlying mass cannot be excluded, these findings probably  represent inflammatory change. Follow-up imaging is going to be recommended to ensure resolution of this finding. There is no evidence of pancreatic necrosis or pancreatic hemorrhage. No evidence of visceral artery aneurysm. Stranding also affects the descending duodenum. Spleen: Unremarkable Adrenals/Urinary Tract: 6 mm calculus in the mid right kidney. Left kidney and adrenal glands are unremarkable. Bladder is within normal limits. Stomach/Bowel: Small hiatal hernia. There is no evidence of small-bowel obstruction. Colon is decompressed and is without obvious mass. Terminal ileum is within normal limits. Lymphatic: No abnormal retroperitoneal adenopathy. Reproductive: Unremarkable prostate. Other: There is trace fluid layering in the pelvis. There is also trace free fluid about the spleen. Musculoskeletal: No vertebral compression deformity. IMPRESSION: VASCULAR Aortic Atherosclerosis (ICD10-I70.0). NON-VASCULAR Findings are compatible with acute pancreatitis. The pancreatic head and central body are heterogeneous. Underlying mass is not excluded although these findings most likely reflect inflammation. Follow-up imaging is recommended to ensure resolution of this finding. Right nephrolithiasis. Electronically Signed   By: Jolaine Click M.D.   On: 03/28/2017 09:34   US Abdomen Limited Ruq  Result Date: 03/28/2017 CLINICAL DATA:  Pancreatitis. EXAM: ULTRASOUND ABDOMEN LIMITED RIGHT UPPER QUADRANT COMPARISON:  CT scan dated 03/28/2017 FINDINGS: Gallbladder: There are numerous stones in the gallbladder. There is gallbladder wall thickening with pericholecystic fluid. Negative sonographic Murphy's sign. Common bile duct: Diameter: 6 mm. No discrete stones in the bile duct. No dilated intrahepatic bile ducts. Liver: No focal lesion identified. Slight diffuse increased echogenicity of the parenchyma suggesting mild hepatic steatosis. Portal vein is patent on color Doppler imaging with normal direction of blood flow  towards the liver. IMPRESSION: 1. Cholelithiasis with thickened gallbladder wall and pericholecystic fluid suggestive of cholecystitis. 2. Slight prominence of the common bile duct without a discrete common bile duct stone. 3. Mild hepatic steatosis. Electronically Signed   By: Francene Boyers M.D.   On: 03/28/2017 10:51      ____________________________________________   PROCEDURES  Procedure(s) performed: None Procedures Critical Care performed: yes  CRITICAL CARE Performed by: Nita Sickle  ?  Total critical care time: 40 min  Critical care time was exclusive of separately billable procedures and treating other patients.  Critical care was necessary to treat or prevent imminent or life-threatening deterioration.  Critical care was time spent personally by me on the following activities: development of treatment plan with patient and/or surrogate as well as nursing, discussions with consultants, evaluation of patient's response to treatment, examination of patient, obtaining history from patient or surrogate, ordering and performing treatments and interventions, ordering and review of laboratory studies, ordering and review of radiographic studies, pulse oximetry and re-evaluation of patient's condition.  ____________________________________________   INITIAL IMPRESSION / ASSESSMENT AND PLAN / ED COURSE   47 y.o. male a history of a hiatal hernia who presents  for evaluation of 12 hours of constant severe cramping abdominal pain radiating to his back associated with nausea and vomiting and numbness of b/l hands and feet. Patient looks pale, clammy, in obvious distress, dry, abdomen is soft with diffuse tenderness and guarding. EKG with no ischemic changes. Differential diagnoses including dissection vs SBO vs gallbladder pathology versus pancreatitis versus mesenteric ischemia versus volvulus versus colitis versus gastritis versus peptic ulcer disease. Spoke with CT for stat  imaging. Patient was given Zofran, morphine, and fluids for his symptoms.    _________________________ 11:05 AM on 03/28/2017 -----------------------------------------  CT concerning for pancreatitis. Patient was sent for right upper quadrant ultrasound which is consistent with choledocholithiasis, acute cholecystitis, and gallstone pancreatitis. Labs showing anion gap 17, carb of 18, glucose of 217, AST of 331 and ALT of 178. Patient was started on Zosyn, David fluids maintenance. Discussed with Dr. Aleen Campi who recommended admission to the hospitalist for ERCP and management of pancreatitis. Surgery will follow patient in house.   As part of my medical decision making, David reviewed the following data within the electronic MEDICAL RECORD NUMBER Nursing notes reviewed and incorporated, Labs reviewed , EKG interpreted , Radiograph reviewed , Discussed with admitting physician , A consult was requested and obtained from this/these consultant(s) GI and Surgery, Notes from prior ED visits and [place] Controlled Substance Database    Pertinent labs & imaging results that were available during my care of the patient were reviewed by me and considered in my medical decision making (see chart for details).    ____________________________________________   FINAL CLINICAL IMPRESSION(S) / ED DIAGNOSES  Final diagnoses:  Pancreatitis  Choledocholithiasis with acute cholecystitis  Acute gallstone pancreatitis      NEW MEDICATIONS STARTED DURING THIS VISIT:  ED Discharge Orders    None       Note:  This document was prepared using Dragon voice recognition software and may include unintentional dictation errors.    Don Perking, [place], Warren David 03/28/17 [phone]

## 2017-03-29 DIAGNOSIS — R748 Abnormal levels of other serum enzymes: Secondary | ICD-10-CM

## 2017-03-29 DIAGNOSIS — K851 Biliary acute pancreatitis without necrosis or infection: Secondary | ICD-10-CM | POA: Diagnosis not present

## 2017-03-29 LAB — COMPREHENSIVE METABOLIC PANEL
ALBUMIN: 3.4 g/dL — AB (ref 3.5–5.0)
ALK PHOS: 110 U/L (ref 38–126)
ALT: 203 U/L — ABNORMAL HIGH (ref 17–63)
ANION GAP: 8 (ref 5–15)
AST: 134 U/L — ABNORMAL HIGH (ref 15–41)
BUN: 10 mg/dL (ref 6–20)
CHLORIDE: 107 mmol/L (ref 101–111)
CO2: 25 mmol/L (ref 22–32)
Calcium: 7.8 mg/dL — ABNORMAL LOW (ref 8.9–10.3)
Creatinine, Ser: 0.87 mg/dL (ref 0.61–1.24)
GFR calc non Af Amer: >60 mL/min (ref >60)
GLUCOSE: 138 mg/dL — AB (ref 65–99)
Potassium: 3.8 mmol/L (ref 3.5–5.1)
SODIUM: 140 mmol/L (ref 135–145)
Total Bilirubin: 1.8 mg/dL — ABNORMAL HIGH (ref 0.3–1.2)
Total Protein: 6 g/dL — ABNORMAL LOW (ref 6.5–8.1)

## 2017-03-29 LAB — CBC
HCT: 44 % (ref 40.0–52.0)
HEMOGLOBIN: 14.5 g/dL (ref 13.0–18.0)
MCH: 30.2 pg (ref 26.0–34.0)
MCHC: 32.8 g/dL (ref 32.0–36.0)
MCV: 91.9 fL (ref 80.0–100.0)
Platelets: 168 10*3/uL (ref 150–440)
RBC: 4.79 MIL/uL (ref 4.40–5.90)
RDW: 12.6 % (ref 11.5–14.5)
WBC: 13.8 10*3/uL — ABNORMAL HIGH (ref 3.8–10.6)

## 2017-03-29 LAB — LIPASE, BLOOD: LIPASE: 762 U/L — AB (ref 11–51)

## 2017-03-29 MED ORDER — GADOBENATE DIMEGLUMINE 529 MG/ML IV SOLN
15.0000 mL | Freq: Once | INTRAVENOUS | Status: AC | PRN
  Administered 2017-03-29: 14 mL via INTRAVENOUS

## 2017-03-29 NOTE — Consult Note (Signed)
Warren Antigua, MD [address], [place], [place], [place], [address] [address] [place], [place], [place], [address] Phone: [phone]  Fax: [phone]  Consultation  Referring Provider:     Dr. Benjie Karvonen Primary Care Physician:  Venia Carbon, MD Primary Gastroenterologist:  Virgel Manifold, MD        Reason for Consultation:     Pancreatitis, abdominal pain  Date of Admission:  03/28/2017 Date of Consultation:  03/29/2017         HPI:   Warren David is a 47 y.o. male presents with 2-day history of intermittent diffuse abdominal pain, sharp, 10/10, associated with nausea and vomiting, with no radiation, not associated with any fever chills.  No previous history of similar episodes.  On admission, CT scan showed acute pancreatitis, ultrasound showed acute cholecystitis with gallstones.  Ultrasound reported slight prominence of the common bile duct without a discrete common bile duct stone.  Mild hepatic steatosis.  GI was consulted to evaluate for ERCP. MRCP today, showed mild to moderate acute pink otitis.  Cholelithiasis, and reports no evidence of biliary duct dilation or choledocholithiasis.  Patient reports pain is overall better since it started 2 days ago.  He is receiving pain medications while he has been here and has been on David fluids.  Last episode of emesis was yesterday evening, prior to that was yesterday morning.  He has only had 2 episodes of emesis since the symptoms started.  He was started on clear liquid diet today, and has tolerated half a cup of water since he was put on clear liquid diet 2 hours ago.  Lipase on admission was over 10,000, and it is 762 today.  CMP showed a mild elevation of bilirubin to 1.7 on admission yesterday, it is 1.8 today.  Transaminases were elevated to AST of 331, ALT 178, and it has decreased to 134 and 203 respectively.  Alk phos is normal.  Patient reports drinking 2-3 drinks of beer about 2-3 times a  week.  Past Medical History:  Diagnosis Date  . Asthma   . Hiatal hernia     Past Surgical History:  Procedure Laterality Date  . ESOPHAGOGASTRODUODENOSCOPY (EGD) WITH PROPOFOL N/A 03/01/2016   Procedure: ESOPHAGOGASTRODUODENOSCOPY (EGD) WITH PROPOFOL;  Surgeon: Lucilla Lame, MD;  Location: [facility] ENDOSCOPY;  Service: Endoscopy;  Laterality: N/A;  . HYDROCELE EXCISION    . INGUINAL HERNIA REPAIR      Prior to Admission medications   Medication Sig Start Date End Date Taking? Authorizing Provider  Albuterol Sulfate (PROAIR RESPICLICK) 433 (90 BASE) MCG/ACT AEPB Inhale 2 puffs into the lungs 4 (four) times daily as needed. 10/10/13  Yes Venia Carbon, MD  fluticasone (FLONASE) 50 MCG/ACT nasal spray Place into both nostrils daily.   Yes [provider]  loratadine (CLARITIN) 10 MG tablet Take 10 mg by mouth daily.   Yes [provider]  omeprazole (PRILOSEC) 40 MG capsule Take 40 mg by mouth daily.   Yes [provider]    No family history on file.   Social History   Tobacco Use  . Smoking status: Never Smoker  . Smokeless tobacco: Never Used  Substance Use Topics  . Alcohol use: Yes    Alcohol/week: 0.0 oz  . Drug use: No    Allergies as of 03/28/2017 - Review Complete 03/28/2017  Allergen Reaction Noted  . Fluticasone-salmeterol      Review of Systems:    All systems reviewed and negative  except where noted in HPI.   Physical Exam:  Vital signs in last 24 hours: Vitals:   03/28/17 1714 03/28/17 1759 03/28/17 2022 03/29/17 0455  BP: (!) 141/85  134/82 124/76  Pulse: 89  (!) 107 98  Resp:   18 18  Temp:  98.3 F (36.8 C) 98.2 F (36.8 C) 98.1 F (36.7 C)  TempSrc:  Oral Oral Oral  SpO2: 100%  97% 97%  Weight:      Height:       Last BM Date: 03/28/17 General:   Pleasant, cooperative in NAD Head:  Normocephalic and atraumatic. Eyes:   No icterus.   Conjunctiva pink. PERRLA. Ears:  Normal auditory acuity. Neck:  Supple; no  masses or thyroidomegaly Lungs: Respirations even and unlabored. Lungs clear to auscultation bilaterally.   No wheezes, crackles, or rhonchi.  Heart:  Regular rate and rhythm;  Without murmur, clicks, rubs or gallops Abdomen:  Soft, nondistended, nontender. Normal bowel sounds. No appreciable masses or hepatomegaly.  No rebound or guarding.  Neurologic:  Alert and oriented x3;  grossly normal neurologically. Skin:  Intact without significant lesions or rashes. Cervical Nodes:  No significant cervical adenopathy. Psych:  Alert and cooperative. Normal affect.  LAB RESULTS: Recent Labs    03/28/17 0838 03/29/17 0612  WBC 18.0* 13.8*  HGB 16.6 14.5  HCT 50.0 44.0  PLT 271 168   BMET Recent Labs    03/28/17 0838 03/29/17 0612  NA 138 140  K 3.3* 3.8  CL 103 107  CO2 18* 25  GLUCOSE 217* 138*  BUN 14 10  CREATININE 1.23 0.87  CALCIUM 9.6 7.8*   LFT Recent Labs    03/29/17 0612  PROT 6.0*  ALBUMIN 3.4*  AST 134*  ALT 203*  ALKPHOS 110  BILITOT 1.8*   PT/INR No results for input(s): LABPROT, INR in the last 72 hours.  STUDIES: Mr 3d Recon At Scanner  Result Date: 03/29/2017 CLINICAL DATA:  Acute pancreatitis. Elevated liver function tests. Cholelithiasis. EXAM: MRI ABDOMEN WITHOUT AND WITH CONTRAST (INCLUDING MRCP) TECHNIQUE: Multiplanar multisequence MR imaging of the abdomen was performed both before and after the administration of intravenous contrast. Heavily T2-weighted images of the biliary and pancreatic ducts were obtained, and three-dimensional MRCP images were rendered by post processing. CONTRAST:  97m MULTIHANCE GADOBENATE DIMEGLUMINE 529 MG/ML David SOLN COMPARISON:  CT on 03/28/2017 FINDINGS: Lower chest: Bilateral lower lobe atelectasis. Hepatobiliary: No hepatic masses identified. Multiple gallstones are seen. No evidence of gallbladder dilatation or abnormal wall thickening. Common bile duct measures 5 mm, which is within normal limits. No evidence of  choledocholithiasis. Pancreas: Diffuse pancreatic swelling and peripancreatic edema, with small amount of fluid in the peripancreatic region which extending inferiorly within the retroperitoneum. No evidence of pancreatic mass or necrosis. No evidence of pancreatic ductal dilatation or pancreas divisum. No evidence of pseudocysts within the abdomen. Spleen:  Within normal limits in size and appearance. Adrenals/Urinary Tract: No masses identified. No evidence of hydronephrosis. Stomach/Bowel: Visualized abdominal bowel unremarkable. Vascular/Lymphatic: No pathologically enlarged lymph nodes identified. No abdominal aortic aneurysm. Other:  None. Musculoskeletal:  No suspicious bone lesions identified. IMPRESSION: Mild to moderate acute pancreatitis, with peripancreatic fluid extending inferiorly within the retroperitoneum. No evidence of pancreatic necrosis, mass, or pseudocysts. Cholelithiasis, without definite radiographic evidence of cholecystitis. No evidence of biliary ductal dilatation or choledocholithiasis. Bibasilar atelectasis. Electronically Signed   By: JEarle GellM.D.   On: 03/29/2017 08:33   Mr Abdomen Mrcp WMoise BoringContast  Result  Date: 03/29/2017 CLINICAL DATA:  Acute pancreatitis. Elevated liver function tests. Cholelithiasis. EXAM: MRI ABDOMEN WITHOUT AND WITH CONTRAST (INCLUDING MRCP) TECHNIQUE: Multiplanar multisequence MR imaging of the abdomen was performed both before and after the administration of intravenous contrast. Heavily T2-weighted images of the biliary and pancreatic ducts were obtained, and three-dimensional MRCP images were rendered by post processing. CONTRAST:  56m MULTIHANCE GADOBENATE DIMEGLUMINE 529 MG/ML David SOLN COMPARISON:  CT on 03/28/2017 FINDINGS: Lower chest: Bilateral lower lobe atelectasis. Hepatobiliary: No hepatic masses identified. Multiple gallstones are seen. No evidence of gallbladder dilatation or abnormal wall thickening. Common bile duct measures 5 mm, which  is within normal limits. No evidence of choledocholithiasis. Pancreas: Diffuse pancreatic swelling and peripancreatic edema, with small amount of fluid in the peripancreatic region which extending inferiorly within the retroperitoneum. No evidence of pancreatic mass or necrosis. No evidence of pancreatic ductal dilatation or pancreas divisum. No evidence of pseudocysts within the abdomen. Spleen:  Within normal limits in size and appearance. Adrenals/Urinary Tract: No masses identified. No evidence of hydronephrosis. Stomach/Bowel: Visualized abdominal bowel unremarkable. Vascular/Lymphatic: No pathologically enlarged lymph nodes identified. No abdominal aortic aneurysm. Other:  None. Musculoskeletal:  No suspicious bone lesions identified. IMPRESSION: Mild to moderate acute pancreatitis, with peripancreatic fluid extending inferiorly within the retroperitoneum. No evidence of pancreatic necrosis, mass, or pseudocysts. Cholelithiasis, without definite radiographic evidence of cholecystitis. No evidence of biliary ductal dilatation or choledocholithiasis. Bibasilar atelectasis. Electronically Signed   By: JEarle GellM.D.   On: 03/29/2017 08:33   Ct Angio Abd/pel W And/or Wo Contrast  Result Date: 03/28/2017 CLINICAL DATA:  Severe abdominal pain since yesterday EXAM: CTA ABDOMEN AND PELVIS wITHOUT AND WITH CONTRAST TECHNIQUE: Multidetector CT imaging of the abdomen and pelvis was performed using the standard protocol during bolus administration of intravenous contrast. Multiplanar reconstructed images and MIPs were obtained and reviewed to evaluate the vascular anatomy. CONTRAST:  1050mISOVUE-370 IOPAMIDOL (ISOVUE-370) INJECTION 76% COMPARISON:  None. FINDINGS: VASCULAR Aorta: Nonaneurysmal and patent. Minimal infrarenal atherosclerotic calcification. Celiac: Patent.  Branch vessels patent. SMA: Patent Renals: 3 right renal arteries and 2 left renal arteries are patent. IMA: Patent Inflow: Bilateral common,  internal, and external iliac arteries are patent. A tiny low-density linear flap at the origin of the left internal iliac artery probably simply reflects signal averaging artifact rather than a dissection. There are no associated atherosclerotic changes. Proximal Outflow: Grossly patent bilaterally. Veins: No evidence of DVT. Splenic vein is patent. Superior mesenteric vein is patent. Portal vein is patent. Review of the MIP images confirms the above findings. NON-VASCULAR Lower chest: Clear Hepatobiliary: Mild diffuse hepatic steatosis. Moderate diffuse gallbladder wall thickening. No obvious gallstones. Pancreas: There is fluid density and stranding surrounding the entire pancreatic gland compatible with acute pancreatitis. There is blurring of the fat planes in the head of the pancreas. The head and central body of the pancreas is somewhat heterogeneous. See image 20 of series 5. Although underlying mass cannot be excluded, these findings probably represent inflammatory change. Follow-up imaging is going to be recommended to ensure resolution of this finding. There is no evidence of pancreatic necrosis or pancreatic hemorrhage. No evidence of visceral artery aneurysm. Stranding also affects the descending duodenum. Spleen: Unremarkable Adrenals/Urinary Tract: 6 mm calculus in the mid right kidney. Left kidney and adrenal glands are unremarkable. Bladder is within normal limits. Stomach/Bowel: Small hiatal hernia. There is no evidence of small-bowel obstruction. Colon is decompressed and is without obvious mass. Terminal ileum is within normal limits. Lymphatic: No  abnormal retroperitoneal adenopathy. Reproductive: Unremarkable prostate. Other: There is trace fluid layering in the pelvis. There is also trace free fluid about the spleen. Musculoskeletal: No vertebral compression deformity. IMPRESSION: VASCULAR Aortic Atherosclerosis (ICD10-I70.0). NON-VASCULAR Findings are compatible with acute pancreatitis. The  pancreatic head and central body are heterogeneous. Underlying mass is not excluded although these findings most likely reflect inflammation. Follow-up imaging is recommended to ensure resolution of this finding. Right nephrolithiasis. Electronically Signed   By: Marybelle Killings M.D.   On: 03/28/2017 09:34   US Abdomen Limited Ruq  Result Date: 03/28/2017 CLINICAL DATA:  Pancreatitis. EXAM: ULTRASOUND ABDOMEN LIMITED RIGHT UPPER QUADRANT COMPARISON:  CT scan dated 03/28/2017 FINDINGS: Gallbladder: There are numerous stones in the gallbladder. There is gallbladder wall thickening with pericholecystic fluid. Negative sonographic Murphy's sign. Common bile duct: Diameter: 6 mm. No discrete stones in the bile duct. No dilated intrahepatic bile ducts. Liver: No focal lesion identified. Slight diffuse increased echogenicity of the parenchyma suggesting mild hepatic steatosis. Portal vein is patent on color Doppler imaging with normal direction of blood flow towards the liver. IMPRESSION: 1. Cholelithiasis with thickened gallbladder wall and pericholecystic fluid suggestive of cholecystitis. 2. Slight prominence of the common bile duct without a discrete common bile duct stone. 3. Mild hepatic steatosis. Electronically Signed   By: Lorriane Shire M.D.   On: 03/28/2017 10:51      Impression / Plan:   Warren David is a 47 y.o. y/o male with gallstone pancreatitis and cholecystitis  Patient's MRCP does not show any evidence of choledocholithiasis, and no CBD dilation ERCP thus not indicated at this time ERCP entails risk of pancreatitis itself.  And in the absence of choledocholithiasis as in this patient, evidenced by the MRCP, ERCP at this time would have higher risks and benefits. When surgery decides to proceed with cholecystectomy, and intraoperative cholangiogram can be done at that time to evaluate the bile duct. In the meantime, would recommend aggressive management for pancreatitis with David  fluids and pain management Would recommend lactated Ringer's at 250-300 cc an hour for the first 24-48 hours presentation Once patient's pain improves, can advance diet slowly, starting with clear to full liquid diet and advancing to low-fat diet as tolerated.  Fluids can be decreased after he is tolerating an oral diet as well Continue daily CMP Avoid hepatotoxic drugs Encourage alcohol abstinence Consider CIWA protocol Folate, thiamine  will continue to follow.  Thank you for involving me in the care of this patient.      LOS: 1 day   Virgel Manifold, MD  03/29/2017, 12:14 PM

## 2017-03-29 NOTE — Progress Notes (Signed)
Pharmacy Electrolyte Monitoring Consult:  Pharmacy consulted to assist in monitoring and replacing electrolytes in this 47 y.o. male admitted on 03/28/2017 with Abdominal Pain Gallstone pancreatitis  Labs:  Sodium (mmol/L)  Date Value  03/29/2017 140   Potassium (mmol/L)  Date Value  03/29/2017 3.8   Magnesium (mg/dL)  Date Value  16/10/960402/01/2018 1.9   Calcium (mg/dL)  Date Value  54/09/811902/13/2019 7.8 (L)   Albumin (g/dL)  Date Value  14/78/295602/13/2019 3.4 (L)    Plan: Electrolytes WNL. Will f/u electrolytes in am  Bari MantisKristin Lenvil Swaim PharmD Clinical Pharmacist 03/29/2017

## 2017-03-29 NOTE — Progress Notes (Addendum)
ADDENDUM:  Patient reports epigastric pain continues to improve, though describes pain with coughing or hiccups, tolerated some clear liquids today. He has not yet ambulated beyond his room in the halls yet, says he'd like to do so. 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 Continue pain control and current management for now and will reassess diet in the morning with follow-up labs.  -- Scherrie GerlachJason E. Earlene Plateravis, MD, RPVI [place]: [facility] - Partnering for exceptional care. Office: [phone]      SURGICAL PROGRESS NOTE (cpt [phone])  Hospital Day(s): 1.   Post op day(s):  Marland Kitchen.   Interval History: Patient seen and examined, no acute events or new complaints overnight. Patient reports his pain is somewhat better, but still hurts and he'd just received morphine prior to assessment/exam, denies N/V, fever/chills, CP, or SOB.  Review of Systems:  Constitutional: denies fever, chills  HEENT: denies cough or congestion  Respiratory: denies any shortness of breath  Cardiovascular: denies chest pain or palpitations  Gastrointestinal: abdominal pain, N/V, and bowel function as per interval history Genitourinary: denies burning with urination or urinary frequency Musculoskeletal: denies pain, decreased motor or sensation Integumentary: denies any other rashes or skin discolorations Neurological: denies HA or vision/hearing changes   Vital signs in last 24 hours: [min-max] current  Temp:  [98.1 F (36.7 C)-98.3 F (36.8 C)] 98.1 F (36.7 C) (02/13 0455) Pulse Rate:  [84-112] 98 (02/13 0455) Resp:  [15-22] 18 (02/13 0455) BP: (119-141)/(68-96) 124/76 (02/13 0455) SpO2:  [92 %-100 %] 97 % (02/13 0455)     Height: 5\' 7"  (170.2 cm) Weight: 158 lb (71.7 kg) BMI (Calculated): 24.74   Intake/Output this shift:  Total I/O In: 2303 [I.V.:2303] Out: -     Intake/Output last 2 shifts:  @IOLAST2SHIFTS @   Physical Exam:  Constitutional: alert, cooperative and no distress  HENT: normocephalic without obvious abnormality  Eyes: PERRL, EOM's grossly intact and symmetric  Neuro: CN II - XII grossly intact and symmetric without deficit  Respiratory: breathing non-labored at rest  Cardiovascular: regular rate and sinus rhythm  Gastrointestinal: soft and non-distended with moderately severe epigastric and central abdominal tenderness to palpation Musculoskeletal: UE and LE FROM, no edema or wounds, motor and sensation grossly intact, NT   Labs:  CBC Latest Ref Rng & Units 03/29/2017 03/28/2017 08/21/2009  WBC 3.8 - 10.6 K/uL 13.8(H) 18.0(H) 8.2  Hemoglobin 13.0 - 18.0 g/dL 91.414.5 78.216.6 95.614.7  Hematocrit 40.0 - 52.0 % 44.0 50.0 43.0  Platelets 150 - 440 K/uL 168 271 206   CMP Latest Ref Rng & Units 03/29/2017 03/28/2017 08/21/2009  Glucose 65 - 99 mg/dL 213(Y138(H) 865(H217(H) 97  BUN 6 - 20 mg/dL 10 14 13   Creatinine 0.61 - 1.24 mg/dL 8.460.87 9.621.23 9.520.96  Sodium 135 - 145 mmol/L 140 138 141  Potassium 3.5 - 5.1 mmol/L 3.8 3.3(L) 3.9  Chloride 101 - 111 mmol/L 107 103 100  CO2 22 - 32 mmol/L 25 18(L) 26  Calcium 8.9 - 10.3 mg/dL 7.8(L) 9.6 9.6  Total Protein 6.5 - 8.1 g/dL 6.0(L) 7.9 7.5  Total Bilirubin 0.3 - 1.2 mg/dL 8.4(X1.8(H) 3.2(G1.7(H) 0.4  Alkaline Phos 38 - 126 U/L 110 121 102  AST 15 - 41 U/L 134(H) 331(H) 40(H)  ALT 17 - 63 U/L 203(H) 178(H) 64(H)   Lipase (03/29/2017): 762 (significantly decreased from >10,000 yesterday)  Imaging studies:  MRCP (03/28/2017)  Hepatobiliary: No hepatic masses identified. Multiple gallstones  are seen. No evidence of gallbladder dilatation or abnormal wall thickening. Common bile duct measures 5 mm, which is within  normal limits. No evidence of choledocholithiasis.  Pancreas: Diffuse pancreatic swelling and peripancreatic edema,  with small amount of fluid in the peripancreatic region which extending inferiorly within the  retroperitoneum. No evidence of pancreatic mass  or necrosis. No evidence of pancreatic ductal dilatation orpancreas  divisum. No evidence of pseudocysts within the abdomen.   Assessment/Plan: (ICD-10's: K41.1) 47 y.o. male with improving severe pancreatitis attributed to cholelithiasis (likely passed a gallstone, considering no intra- or extra- hepatic ductal dilation and no choledocholithiasis visualized on MRCP), despite persistently elevated total bilirubin with precipitously decreasing lipase and leukocytosis, complicated by comorbidities including asthma and hiatal hernia.   - pain control prn   - NPO for now with IV fluids   - no urgent or emergent surgical intervention indicated at this time  - follow-up / trend repeat LFT's, lipase, and WBC tomorrow morning  - considering severity of pancreatitis, anticipate outpatient cholecystectomy following resolution of acute pancreatitis  - medical management as per primary medical team  - DVT prophylaxis, ambulation encouraged  All of the above findings and recommendations were discussed with the patient, medical physician, and patient's RN, and all of patient's questions were answered to his expressed satisfaction.  Thank you for the opportunity to participate in this patient's care.  -- Scherrie Gerlach Earlene Plater, MD, RPVI [place]: [facility] General [facility] - Partnering for exceptional care. Office: [phone]

## 2017-03-29 NOTE — Progress Notes (Signed)
[facility] - [place] at [facility]   PATIENT NAME: Warren David    MR#:  [id number]  DATE OF BIRTH:  11-22-1970  SUBJECTIVE:  CHIEF COMPLAINT:  Pt has epigastric abd pain  REVIEW OF SYSTEMS:  CONSTITUTIONAL: No fever, fatigue or weakness.  EYES: No blurred or double vision.  EARS, NOSE, AND THROAT: No tinnitus or ear pain.  RESPIRATORY: No cough, shortness of breath, wheezing or hemoptysis.  CARDIOVASCULAR: No chest pain, orthopnea, edema.  GASTROINTESTINAL: No nausea, vomiting, diarrhea; has epigastric abdominal pain.  GENITOURINARY: No dysuria, hematuria.  ENDOCRINE: No polyuria, nocturia,  HEMATOLOGY: No anemia, easy bruising or bleeding SKIN: No rash or lesion. MUSCULOSKELETAL: No joint pain or arthritis.   NEUROLOGIC: No tingling, numbness, weakness.  PSYCHIATRY: No anxiety or depression.   DRUG ALLERGIES:   Allergies  Allergen Reactions  . Fluticasone-Salmeterol     REACTION: palpatations    VITALS:  Blood pressure 132/86, pulse 97, temperature 98.6 F (37 C), temperature source Oral, resp. rate 18, height 5\' 7"  (1.702 m), weight 71.7 kg (158 lb), SpO2 97 %.  PHYSICAL EXAMINATION:  GENERAL:  47 y.o.-year-old patient lying in the bed with no acute distress.  EYES: Pupils equal, round, reactive to light and accommodation. No scleral icterus. Extraocular muscles intact.  HEENT: Head atraumatic, normocephalic. Oropharynx and nasopharynx clear.  NECK:  Supple, no jugular venous distention. No thyroid enlargement, no tenderness.  LUNGS: Normal breath sounds bilaterally, no wheezing, rales,rhonchi or crepitation. No use of accessory muscles of respiration.  CARDIOVASCULAR: S1, S2 normal. No murmurs, rubs, or gallops.  ABDOMEN: Soft,epigastric  tenderness, nondistended. Bowel sounds present. EXTREMITIES: No pedal edema, cyanosis, or clubbing.  NEUROLOGIC: Cranial nerves II through XII are intact. Muscle strength 5/5 in all extremities. Sensation  intact. Gait not checked.  PSYCHIATRIC: The patient is alert and oriented x 3.  SKIN: No obvious rash, lesion, or ulcer.    LABORATORY PANEL:   CBC Recent Labs  Lab 03/29/17 0612  WBC 13.8*  HGB 14.5  HCT 44.0  PLT 168   ------------------------------------------------------------------------------------------------------------------  Chemistries  Recent Labs  Lab 03/28/17 0838 03/29/17 0612  NA 138 140  K 3.3* 3.8  CL 103 107  CO2 18* 25  GLUCOSE 217* 138*  BUN 14 10  CREATININE 1.23 0.87  CALCIUM 9.6 7.8*  MG 1.9  --   AST 331* 134*  ALT 178* 203*  ALKPHOS 121 110  BILITOT 1.7* 1.8*   ------------------------------------------------------------------------------------------------------------------  Cardiac Enzymes No results for input(s): TROPONINI in the last 168 hours. ------------------------------------------------------------------------------------------------------------------  RADIOLOGY:  Mr 3d Recon At Scanner  Result Date: 03/29/2017 CLINICAL DATA:  Acute pancreatitis. Elevated liver function tests. Cholelithiasis. EXAM: MRI ABDOMEN WITHOUT AND WITH CONTRAST (INCLUDING MRCP) TECHNIQUE: Multiplanar multisequence MR imaging of the abdomen was performed both before and after the administration of intravenous contrast. Heavily T2-weighted images of the biliary and pancreatic ducts were obtained, and three-dimensional MRCP images were rendered by post processing. CONTRAST:  14mL MULTIHANCE GADOBENATE DIMEGLUMINE 529 MG/ML IV SOLN COMPARISON:  CT on 03/28/2017 FINDINGS: Lower chest: Bilateral lower lobe atelectasis. Hepatobiliary: No hepatic masses identified. Multiple gallstones are seen. No evidence of gallbladder dilatation or abnormal wall thickening. Common bile duct measures 5 mm, which is within normal limits. No evidence of choledocholithiasis. Pancreas: Diffuse pancreatic swelling and peripancreatic edema, with small amount of fluid in the peripancreatic  region which extending inferiorly within the retroperitoneum. No evidence of pancreatic mass or necrosis. No evidence of pancreatic ductal dilatation or pancreas  divisum. No evidence of pseudocysts within the abdomen. Spleen:  Within normal limits in size and appearance. Adrenals/Urinary Tract: No masses identified. No evidence of hydronephrosis. Stomach/Bowel: Visualized abdominal bowel unremarkable. Vascular/Lymphatic: No pathologically enlarged lymph nodes identified. No abdominal aortic aneurysm. Other:  None. Musculoskeletal:  No suspicious bone lesions identified. IMPRESSION: Mild to moderate acute pancreatitis, with peripancreatic fluid extending inferiorly within the retroperitoneum. No evidence of pancreatic necrosis, mass, or pseudocysts. Cholelithiasis, without definite radiographic evidence of cholecystitis. No evidence of biliary ductal dilatation or choledocholithiasis. Bibasilar atelectasis. Electronically Signed   By: Myles Rosenthal M.D.   On: 03/29/2017 08:33   Mr Abdomen Mrcp Vivien Rossetti Contast  Result Date: 03/29/2017 CLINICAL DATA:  Acute pancreatitis. Elevated liver function tests. Cholelithiasis. EXAM: MRI ABDOMEN WITHOUT AND WITH CONTRAST (INCLUDING MRCP) TECHNIQUE: Multiplanar multisequence MR imaging of the abdomen was performed both before and after the administration of intravenous contrast. Heavily T2-weighted images of the biliary and pancreatic ducts were obtained, and three-dimensional MRCP images were rendered by post processing. CONTRAST:  14mL MULTIHANCE GADOBENATE DIMEGLUMINE 529 MG/ML IV SOLN COMPARISON:  CT on 03/28/2017 FINDINGS: Lower chest: Bilateral lower lobe atelectasis. Hepatobiliary: No hepatic masses identified. Multiple gallstones are seen. No evidence of gallbladder dilatation or abnormal wall thickening. Common bile duct measures 5 mm, which is within normal limits. No evidence of choledocholithiasis. Pancreas: Diffuse pancreatic swelling and peripancreatic edema, with  small amount of fluid in the peripancreatic region which extending inferiorly within the retroperitoneum. No evidence of pancreatic mass or necrosis. No evidence of pancreatic ductal dilatation or pancreas divisum. No evidence of pseudocysts within the abdomen. Spleen:  Within normal limits in size and appearance. Adrenals/Urinary Tract: No masses identified. No evidence of hydronephrosis. Stomach/Bowel: Visualized abdominal bowel unremarkable. Vascular/Lymphatic: No pathologically enlarged lymph nodes identified. No abdominal aortic aneurysm. Other:  None. Musculoskeletal:  No suspicious bone lesions identified. IMPRESSION: Mild to moderate acute pancreatitis, with peripancreatic fluid extending inferiorly within the retroperitoneum. No evidence of pancreatic necrosis, mass, or pseudocysts. Cholelithiasis, without definite radiographic evidence of cholecystitis. No evidence of biliary ductal dilatation or choledocholithiasis. Bibasilar atelectasis. Electronically Signed   By: Myles Rosenthal M.D.   On: 03/29/2017 08:33   Ct Angio Abd/pel W And/or Wo Contrast  Result Date: 03/28/2017 CLINICAL DATA:  Severe abdominal pain since yesterday EXAM: CTA ABDOMEN AND PELVIS wITHOUT AND WITH CONTRAST TECHNIQUE: Multidetector CT imaging of the abdomen and pelvis was performed using the standard protocol during bolus administration of intravenous contrast. Multiplanar reconstructed images and MIPs were obtained and reviewed to evaluate the vascular anatomy. CONTRAST:  ISOVUE-370 IOPAMIDOL (ISOVUE-370) INJECTION 76% COMPARISON:  None. FINDINGS: VASCULAR Aorta: Nonaneurysmal and patent. Minimal infrarenal atherosclerotic calcification. Celiac: Patent.  Branch vessels patent. SMA: Patent Renals: 3 right renal arteries and 2 left renal arteries are patent. IMA: Patent Inflow: Bilateral common, internal, and external iliac arteries are patent. A tiny low-density linear flap at the origin of the left internal iliac artery  probably simply reflects signal averaging artifact rather than a dissection. There are no associated atherosclerotic changes. Proximal Outflow: Grossly patent bilaterally. Veins: No evidence of DVT. Splenic vein is patent. Superior mesenteric vein is patent. Portal vein is patent. Review of the MIP images confirms the above findings. NON-VASCULAR Lower chest: Clear Hepatobiliary: Mild diffuse hepatic steatosis. Moderate diffuse gallbladder wall thickening. No obvious gallstones. Pancreas: There is fluid density and stranding surrounding the entire pancreatic gland compatible with acute pancreatitis. There is blurring of the fat planes in the head of the pancreas.  The head and central body of the pancreas is somewhat heterogeneous. See image 20 of series 5. Although underlying mass cannot be excluded, these findings probably represent inflammatory change. Follow-up imaging is going to be recommended to ensure resolution of this finding. There is no evidence of pancreatic necrosis or pancreatic hemorrhage. No evidence of visceral artery aneurysm. Stranding also affects the descending duodenum. Spleen: Unremarkable Adrenals/Urinary Tract: 6 mm calculus in the mid right kidney. Left kidney and adrenal glands are unremarkable. Bladder is within normal limits. Stomach/Bowel: Small hiatal hernia. There is no evidence of small-bowel obstruction. Colon is decompressed and is without obvious mass. Terminal ileum is within normal limits. Lymphatic: No abnormal retroperitoneal adenopathy. Reproductive: Unremarkable prostate. Other: There is trace fluid layering in the pelvis. There is also trace free fluid about the spleen. Musculoskeletal: No vertebral compression deformity. IMPRESSION: VASCULAR Aortic Atherosclerosis (ICD10-I70.0). NON-VASCULAR Findings are compatible with acute pancreatitis. The pancreatic head and central body are heterogeneous. Underlying mass is not excluded although these findings most likely reflect  inflammation. Follow-up imaging is recommended to ensure resolution of this finding. Right nephrolithiasis. Electronically Signed   By: Jolaine Click M.D.   On: 03/28/2017 09:34   US Abdomen Limited Ruq  Result Date: 03/28/2017 CLINICAL DATA:  Pancreatitis. EXAM: ULTRASOUND ABDOMEN LIMITED RIGHT UPPER QUADRANT COMPARISON:  CT scan dated 03/28/2017 FINDINGS: Gallbladder: There are numerous stones in the gallbladder. There is gallbladder wall thickening with pericholecystic fluid. Negative sonographic Murphy's sign. Common bile duct: Diameter: 6 mm. No discrete stones in the bile duct. No dilated intrahepatic bile ducts. Liver: No focal lesion identified. Slight diffuse increased echogenicity of the parenchyma suggesting mild hepatic steatosis. Portal vein is patent on color Doppler imaging with normal direction of blood flow towards the liver. IMPRESSION: 1. Cholelithiasis with thickened gallbladder wall and pericholecystic fluid suggestive of cholecystitis. 2. Slight prominence of the common bile duct without a discrete common bile duct stone. 3. Mild hepatic steatosis. Electronically Signed   By: Francene Boyers M.D.   On: 03/28/2017 10:51    EKG:   Orders placed or performed during the hospital encounter of 03/28/17  . EKG 12-Lead  . EKG 12-Lead  . ED EKG  . ED EKG  . EKG    ASSESSMENT AND PLAN:    48 year old male with a history of mild intermittent asthma who presents with abdominal pain.   1. Sepsis: Patient presents with leukocytosis, tachycardia, tachypnea sepsis is due to acute cholecystitis from gallstone pancreatitis Continue Zosyn Continue IV fluids  2. Acute gallstone pancreatitis with acute cholecystitis and elevated liver function tests: GI  as well as surgery following , conservative management recommended at this time Patient will need  eventually cholecystectomy Clear liquids with IV fluids at 1 75 cc an hour When necessary pain control and antiemetics Continue Zosyn  for acute cholecystitis Follow liver function test for a.m. (patient reports that he has a history of elevated liver function tests as well as a familial history of elevated LFTs.) Lipase >10,000 --> 780  3. Mild intermittent asthma without signs of exacerbation  4. Hypokalemia: Replete and recheck       All the records are reviewed and case discussed with Care Management/Social Workerr. Management plans discussed with the patient, family and they are in agreement.  CODE STATUS: fc  TOTAL TIME TAKING CARE OF THIS PATIENT: 36 minutes.   POSSIBLE D/C IN1-2  DAYS, DEPENDING ON CLINICAL CONDITION.  Note: This dictation was prepared with Dragon dictation along with smaller phrase  technology. Any transcriptional errors that result from this process are unintentional.   Ramonita LabAruna Penda Venturi M.D on 03/29/2017 at 9:57 PM  Between 7am to 6pm - Pager - [phone] After 6pm go to [email or web address] - password [email or web address] [facility]  [facility]  Office  [phone]  CC: Primary care physician; Karie SchwalbeLetvak, Richard I, MD

## 2017-03-30 DIAGNOSIS — R17 Unspecified jaundice: Secondary | ICD-10-CM

## 2017-03-30 DIAGNOSIS — K8042 Calculus of bile duct with acute cholecystitis without obstruction: Secondary | ICD-10-CM

## 2017-03-30 DIAGNOSIS — K851 Biliary acute pancreatitis without necrosis or infection: Secondary | ICD-10-CM | POA: Diagnosis not present

## 2017-03-30 LAB — COMPREHENSIVE METABOLIC PANEL
ALBUMIN: 3.3 g/dL — AB (ref 3.5–5.0)
ALK PHOS: 95 U/L (ref 38–126)
ALT: 137 U/L — ABNORMAL HIGH (ref 17–63)
AST: 63 U/L — ABNORMAL HIGH (ref 15–41)
Anion gap: 9 (ref 5–15)
BILIRUBIN TOTAL: 2.1 mg/dL — AB (ref 0.3–1.2)
BUN: 8 mg/dL (ref 6–20)
CALCIUM: 7.5 mg/dL — AB (ref 8.9–10.3)
CO2: 23 mmol/L (ref 22–32)
Chloride: 107 mmol/L (ref 101–111)
Creatinine, Ser: 0.86 mg/dL (ref 0.61–1.24)
GFR calc non Af Amer: >60 mL/min (ref >60)
GLUCOSE: 102 mg/dL — AB (ref 65–99)
Potassium: 3.3 mmol/L — ABNORMAL LOW (ref 3.5–5.1)
Sodium: 139 mmol/L (ref 135–145)
TOTAL PROTEIN: 6.2 g/dL — AB (ref 6.5–8.1)

## 2017-03-30 LAB — HEPATIC FUNCTION PANEL
ALK PHOS: 90 U/L (ref 38–126)
ALT: 117 U/L — ABNORMAL HIGH (ref 17–63)
AST: 57 U/L — ABNORMAL HIGH (ref 15–41)
Albumin: 3.1 g/dL — ABNORMAL LOW (ref 3.5–5.0)
BILIRUBIN DIRECT: 0.8 mg/dL — AB (ref 0.1–0.5)
BILIRUBIN INDIRECT: 1.4 mg/dL — AB (ref 0.3–0.9)
BILIRUBIN TOTAL: 2.2 mg/dL — AB (ref 0.3–1.2)
Total Protein: 5.9 g/dL — ABNORMAL LOW (ref 6.5–8.1)

## 2017-03-30 LAB — BILIRUBIN, DIRECT: BILIRUBIN DIRECT: 0.7 mg/dL — AB (ref 0.1–0.5)

## 2017-03-30 LAB — POTASSIUM: Potassium: 3.4 mmol/L — ABNORMAL LOW (ref 3.5–5.1)

## 2017-03-30 LAB — LIPASE, BLOOD: Lipase: 273 U/L — ABNORMAL HIGH (ref 11–51)

## 2017-03-30 LAB — BASIC METABOLIC PANEL
Anion gap: 11 (ref 5–15)
BUN: 9 mg/dL (ref 6–20)
CHLORIDE: 106 mmol/L (ref 101–111)
CO2: 21 mmol/L — AB (ref 22–32)
CREATININE: 1 mg/dL (ref 0.61–1.24)
Calcium: 7.5 mg/dL — ABNORMAL LOW (ref 8.9–10.3)
GFR calc Af Amer: >60 mL/min (ref >60)
GFR calc non Af Amer: >60 mL/min (ref >60)
GLUCOSE: 131 mg/dL — AB (ref 65–99)
POTASSIUM: 3.1 mmol/L — AB (ref 3.5–5.1)
SODIUM: 138 mmol/L (ref 135–145)

## 2017-03-30 LAB — MAGNESIUM
Magnesium: 1.5 mg/dL — ABNORMAL LOW (ref 1.7–2.4)
Magnesium: 2.4 mg/dL (ref 1.7–2.4)

## 2017-03-30 LAB — HIV ANTIBODY (ROUTINE TESTING W REFLEX): HIV Screen 4th Generation wRfx: NONREACTIVE

## 2017-03-30 MED ORDER — POTASSIUM CHLORIDE 20 MEQ/15ML (10%) PO SOLN
20.0000 meq | Freq: Once | ORAL | Status: AC
  Administered 2017-03-30: 20 meq via ORAL
  Filled 2017-03-30: qty 15

## 2017-03-30 MED ORDER — ONDANSETRON HCL 4 MG/2ML IJ SOLN
4.0000 mg | Freq: Four times a day (QID) | INTRAMUSCULAR | Status: AC
  Administered 2017-03-30 – 2017-03-31 (×6): 4 mg via INTRAVENOUS
  Filled 2017-03-30 (×6): qty 2

## 2017-03-30 MED ORDER — POTASSIUM CHLORIDE 10 MEQ/100ML IV SOLN
10.0000 meq | Freq: Once | INTRAVENOUS | Status: AC
  Administered 2017-03-30: 10 meq via INTRAVENOUS

## 2017-03-30 MED ORDER — MAGNESIUM SULFATE 4 GM/100ML IV SOLN
4.0000 g | Freq: Once | INTRAVENOUS | Status: AC
Start: 2017-03-30 — End: 2017-03-30
  Administered 2017-03-30: 4 g via INTRAVENOUS
  Filled 2017-03-30: qty 100

## 2017-03-30 MED ORDER — POTASSIUM CHLORIDE 10 MEQ/100ML IV SOLN
10.0000 meq | INTRAVENOUS | Status: AC
  Administered 2017-03-30 (×2): 10 meq via INTRAVENOUS
  Filled 2017-03-30 (×3): qty 100

## 2017-03-30 MED ORDER — ALUM & MAG HYDROXIDE-SIMETH 200-200-20 MG/5ML PO SUSP
30.0000 mL | ORAL | Status: DC | PRN
  Administered 2017-03-30 – 2017-03-31 (×2): 30 mL via ORAL
  Filled 2017-03-30 (×2): qty 30

## 2017-03-30 MED ORDER — MORPHINE SULFATE (PF) 2 MG/ML IV SOLN
2.0000 mg | INTRAVENOUS | Status: DC | PRN
Start: 2017-03-30 — End: 2017-04-05
  Administered 2017-03-30 – 2017-04-01 (×11): 2 mg via INTRAVENOUS
  Filled 2017-03-30 (×12): qty 1

## 2017-03-30 NOTE — Progress Notes (Signed)
CC: Abdominal pain Subjective: Patient reports that his abdominal pain continues.  He states that somewhat better than when he came in but still very tender in the upper abdomen.  He is very easily winded.  Objective: Vital signs in last 24 hours: Temp:  [98.4 F (36.9 C)-98.6 F (37 C)] 98.4 F (36.9 C) (02/14 0455) Pulse Rate:  [97-99] 99 (02/14 0455) Resp:  [18] 18 (02/14 0455) BP: (132-144)/(76-86) 144/76 (02/14 0455) SpO2:  [95 %-97 %] 96 % (02/14 0455) Last BM Date: 03/28/17  Intake/Output from previous day: 02/13 0701 - 02/14 0700 In: 6810.7 [P.O.:240; I.V.:6420.7; IV Piggyback:150] Out: 1375 [Urine:1375] Intake/Output this shift: Total I/O In: -  Out: 225 [Urine:225]  Physical exam:  General: No acute distress Chest: Clear to auscultation Heart: Regular rate and rhythm Abdomen: Soft, exquisitely tender to palpation in the upper abdomen, nondistended.  Lab Results: CBC  Recent Labs    03/28/17 0838 03/29/17 0612  WBC 18.0* 13.8*  HGB 16.6 14.5  HCT 50.0 44.0  PLT 271 168   BMET Recent Labs    03/29/17 0612 03/29/17 2348  NA 140 139  K 3.8 3.3*  CL 107 107  CO2 25 23  GLUCOSE 138* 102*  BUN 10 8  CREATININE 0.87 0.86  CALCIUM 7.8* 7.5*   PT/INR No results for input(s): LABPROT, INR in the last 72 hours. ABG No results for input(s): PHART, HCO3 in the last 72 hours.  Invalid input(s): PCO2, PO2  Studies/Results: Mr 3d Recon At Scanner  Result Date: 03/29/2017 CLINICAL DATA:  Acute pancreatitis. Elevated liver function tests. Cholelithiasis. EXAM: MRI ABDOMEN WITHOUT AND WITH CONTRAST (INCLUDING MRCP) TECHNIQUE: Multiplanar multisequence MR imaging of the abdomen was performed both before and after the administration of intravenous contrast. Heavily T2-weighted images of the biliary and pancreatic ducts were obtained, and three-dimensional MRCP images were rendered by post processing. CONTRAST:  14mL MULTIHANCE GADOBENATE DIMEGLUMINE 529 MG/ML IV  SOLN COMPARISON:  CT on 03/28/2017 FINDINGS: Lower chest: Bilateral lower lobe atelectasis. Hepatobiliary: No hepatic masses identified. Multiple gallstones are seen. No evidence of gallbladder dilatation or abnormal wall thickening. Common bile duct measures 5 mm, which is within normal limits. No evidence of choledocholithiasis. Pancreas: Diffuse pancreatic swelling and peripancreatic edema, with small amount of fluid in the peripancreatic region which extending inferiorly within the retroperitoneum. No evidence of pancreatic mass or necrosis. No evidence of pancreatic ductal dilatation or pancreas divisum. No evidence of pseudocysts within the abdomen. Spleen:  Within normal limits in size and appearance. Adrenals/Urinary Tract: No masses identified. No evidence of hydronephrosis. Stomach/Bowel: Visualized abdominal bowel unremarkable. Vascular/Lymphatic: No pathologically enlarged lymph nodes identified. No abdominal aortic aneurysm. Other:  None. Musculoskeletal:  No suspicious bone lesions identified. IMPRESSION: Mild to moderate acute pancreatitis, with peripancreatic fluid extending inferiorly within the retroperitoneum. No evidence of pancreatic necrosis, mass, or pseudocysts. Cholelithiasis, without definite radiographic evidence of cholecystitis. No evidence of biliary ductal dilatation or choledocholithiasis. Bibasilar atelectasis. Electronically Signed   By: Myles RosenthalJohn  Stahl M.D.   On: 03/29/2017 08:33   Mr Abdomen Mrcp Vivien RossettiW Wo Contast  Result Date: 03/29/2017 CLINICAL DATA:  Acute pancreatitis. Elevated liver function tests. Cholelithiasis. EXAM: MRI ABDOMEN WITHOUT AND WITH CONTRAST (INCLUDING MRCP) TECHNIQUE: Multiplanar multisequence MR imaging of the abdomen was performed both before and after the administration of intravenous contrast. Heavily T2-weighted images of the biliary and pancreatic ducts were obtained, and three-dimensional MRCP images were rendered by post processing. CONTRAST:  14mL  MULTIHANCE GADOBENATE DIMEGLUMINE 529 MG/ML IV  SOLN COMPARISON:  CT on 03/28/2017 FINDINGS: Lower chest: Bilateral lower lobe atelectasis. Hepatobiliary: No hepatic masses identified. Multiple gallstones are seen. No evidence of gallbladder dilatation or abnormal wall thickening. Common bile duct measures 5 mm, which is within normal limits. No evidence of choledocholithiasis. Pancreas: Diffuse pancreatic swelling and peripancreatic edema, with small amount of fluid in the peripancreatic region which extending inferiorly within the retroperitoneum. No evidence of pancreatic mass or necrosis. No evidence of pancreatic ductal dilatation or pancreas divisum. No evidence of pseudocysts within the abdomen. Spleen:  Within normal limits in size and appearance. Adrenals/Urinary Tract: No masses identified. No evidence of hydronephrosis. Stomach/Bowel: Visualized abdominal bowel unremarkable. Vascular/Lymphatic: No pathologically enlarged lymph nodes identified. No abdominal aortic aneurysm. Other:  None. Musculoskeletal:  No suspicious bone lesions identified. IMPRESSION: Mild to moderate acute pancreatitis, with peripancreatic fluid extending inferiorly within the retroperitoneum. No evidence of pancreatic necrosis, mass, or pseudocysts. Cholelithiasis, without definite radiographic evidence of cholecystitis. No evidence of biliary ductal dilatation or choledocholithiasis. Bibasilar atelectasis. Electronically Signed   By: Myles Rosenthal M.D.   On: 03/29/2017 08:33   Ct Angio Abd/pel W And/or Wo Contrast  Result Date: 03/28/2017 CLINICAL DATA:  Severe abdominal pain since yesterday EXAM: CTA ABDOMEN AND PELVIS wITHOUT AND WITH CONTRAST TECHNIQUE: Multidetector CT imaging of the abdomen and pelvis was performed using the standard protocol during bolus administration of intravenous contrast. Multiplanar reconstructed images and MIPs were obtained and reviewed to evaluate the vascular anatomy. CONTRAST:  ISOVUE-370  IOPAMIDOL (ISOVUE-370) INJECTION 76% COMPARISON:  None. FINDINGS: VASCULAR Aorta: Nonaneurysmal and patent. Minimal infrarenal atherosclerotic calcification. Celiac: Patent.  Branch vessels patent. SMA: Patent Renals: 3 right renal arteries and 2 left renal arteries are patent. IMA: Patent Inflow: Bilateral common, internal, and external iliac arteries are patent. A tiny low-density linear flap at the origin of the left internal iliac artery probably simply reflects signal averaging artifact rather than a dissection. There are no associated atherosclerotic changes. Proximal Outflow: Grossly patent bilaterally. Veins: No evidence of DVT. Splenic vein is patent. Superior mesenteric vein is patent. Portal vein is patent. Review of the MIP images confirms the above findings. NON-VASCULAR Lower chest: Clear Hepatobiliary: Mild diffuse hepatic steatosis. Moderate diffuse gallbladder wall thickening. No obvious gallstones. Pancreas: There is fluid density and stranding surrounding the entire pancreatic gland compatible with acute pancreatitis. There is blurring of the fat planes in the head of the pancreas. The head and central body of the pancreas is somewhat heterogeneous. See image 20 of series 5. Although underlying mass cannot be excluded, these findings probably represent inflammatory change. Follow-up imaging is going to be recommended to ensure resolution of this finding. There is no evidence of pancreatic necrosis or pancreatic hemorrhage. No evidence of visceral artery aneurysm. Stranding also affects the descending duodenum. Spleen: Unremarkable Adrenals/Urinary Tract: 6 mm calculus in the mid right kidney. Left kidney and adrenal glands are unremarkable. Bladder is within normal limits. Stomach/Bowel: Small hiatal hernia. There is no evidence of small-bowel obstruction. Colon is decompressed and is without obvious mass. Terminal ileum is within normal limits. Lymphatic: No abnormal retroperitoneal adenopathy.  Reproductive: Unremarkable prostate. Other: There is trace fluid layering in the pelvis. There is also trace free fluid about the spleen. Musculoskeletal: No vertebral compression deformity. IMPRESSION: VASCULAR Aortic Atherosclerosis (ICD10-I70.0). NON-VASCULAR Findings are compatible with acute pancreatitis. The pancreatic head and central body are heterogeneous. Underlying mass is not excluded although these findings most likely reflect inflammation. Follow-up imaging is recommended to ensure resolution of  this finding. Right nephrolithiasis. Electronically Signed   By: Jolaine Click M.D.   On: 03/28/2017 09:34   US Abdomen Limited Ruq  Result Date: 03/28/2017 CLINICAL DATA:  Pancreatitis. EXAM: ULTRASOUND ABDOMEN LIMITED RIGHT UPPER QUADRANT COMPARISON:  CT scan dated 03/28/2017 FINDINGS: Gallbladder: There are numerous stones in the gallbladder. There is gallbladder wall thickening with pericholecystic fluid. Negative sonographic Murphy's sign. Common bile duct: Diameter: 6 mm. No discrete stones in the bile duct. No dilated intrahepatic bile ducts. Liver: No focal lesion identified. Slight diffuse increased echogenicity of the parenchyma suggesting mild hepatic steatosis. Portal vein is patent on color Doppler imaging with normal direction of blood flow towards the liver. IMPRESSION: 1. Cholelithiasis with thickened gallbladder wall and pericholecystic fluid suggestive of cholecystitis. 2. Slight prominence of the common bile duct without a discrete common bile duct stone. 3. Mild hepatic steatosis. Electronically Signed   By: Francene Boyers M.D.   On: 03/28/2017 10:51    Anti-infectives: Anti-infectives (From admission, onward)   Start     Dose/Rate Route Frequency Ordered Stop   03/28/17 1800  piperacillin-tazobactam (ZOSYN) IVPB 3.375 g     3.375 g 12.5 mL/hr over 240 Minutes Intravenous Every 8 hours 03/28/17 1159     03/28/17 1100  piperacillin-tazobactam (ZOSYN) IVPB 3.375 g     3.375 g 100  mL/hr over 30 Minutes Intravenous  Once 03/28/17 1055 03/28/17 1208      Assessment/Plan:  47 year old male with pancreatitis.  Likely biliary source.  Continues to have tenderness and elevated pancreatic enzymes.  Reiterated to the family the current plan for resolution of his pancreatitis with an outpatient cholecystectomy in the near future.  However, discussed should he fail to continue to improve the plan could be revised.  Recommend continuing to await full resolution of pancreatitis before starting on a diet.  He will need to maintain a low-fat diet even after resolution of the pancreatitis.  General surgery will continue to follow along.  Anetha Slagel T. Tonita Cong, MD, [facility] General Surgeon [facility] [address]  Day [facility] [phone] Night [facility] [phone] 03/30/2017

## 2017-03-30 NOTE — Progress Notes (Signed)
Pharmacy Electrolyte Monitoring Consult:  Pharmacy consulted to assist in monitoring and replacing electrolytes in this 47 y.o. male admitted on 03/28/2017 with Abdominal Pain Gallstone pancreatitis  Labs:  Sodium (mmol/L)  Date Value  03/29/2017 139   Potassium (mmol/L)  Date Value  03/29/2017 3.3 (L)   Magnesium (mg/dL)  Date Value  78/29/562102/14/2019 1.5 (L)   Calcium (mg/dL)  Date Value  30/86/578402/13/2019 7.5 (L)   Albumin (g/dL)  Date Value  69/62/952802/13/2019 3.3 (L)    Plan:  KCl 10 meq iv x 3 and magnesium sulfate 4 g iv once. Will recheck K and Mg at 1800 and all electrolytes with AM labs.   Warren David, PharmD Clinical Pharmacist  03/30/2017

## 2017-03-30 NOTE — Progress Notes (Signed)
[facility] [place] [facility]   PATIENT NAME: Warren David    MR#:  [id number]  DATE OF BIRTH:  06/25/1970  SUBJECTIVE:  CHIEF COMPLAINT:   Pt c/o pain in abdomen and persistent nausea  REVIEW OF SYSTEMS:  CONSTITUTIONAL: No fever, fatigue or weakness.  EYES: No blurred or double vision.  EARS, NOSE, AND THROAT: No tinnitus or ear pain.  RESPIRATORY: No cough, shortness of breath, wheezing or hemoptysis.  CARDIOVASCULAR: No chest pain, orthopnea, edema.  GASTROINTESTINAL: postive nausea, vomiting, diarrhea; has epigastric abdominal pain.  GENITOURINARY: No dysuria, hematuria.  ENDOCRINE: No polyuria, nocturia,  HEMATOLOGY: No anemia, easy bruising or bleeding SKIN: No rash or lesion. MUSCULOSKELETAL: No joint pain or arthritis.   NEUROLOGIC: No tingling, numbness, weakness.  PSYCHIATRY: No anxiety or depression.   DRUG ALLERGIES:   Allergies  Allergen Reactions  . Fluticasone-Salmeterol     REACTION: palpatations    VITALS:  Blood pressure 127/83, pulse 99, temperature 98.1 F (36.7 C), temperature source Oral, resp. rate 18, height 5\' 7"  (1.702 m), weight 158 lb (71.7 kg), SpO2 93 %.  PHYSICAL EXAMINATION:  GENERAL:  47 y.o.-year-old patient lying in the bed with no acute distress.  EYES: Pupils equal, round, reactive to light and accommodation. No scleral icterus. Extraocular muscles intact.  HEENT: Head atraumatic, normocephalic. Oropharynx and nasopharynx clear.  NECK:  Supple, no jugular venous distention. No thyroid enlargement, no tenderness.  LUNGS: Normal breath sounds bilaterally, no wheezing, rales,rhonchi or crepitation. No use of accessory muscles of respiration.  CARDIOVASCULAR: S1, S2 normal. No murmurs, rubs, or gallops.  ABDOMEN: Soft,epigastric  tenderness, nondistended. Bowel sounds present. EXTREMITIES: No pedal edema, cyanosis, or clubbing.  NEUROLOGIC: Cranial nerves II through XII are intact. Muscle strength 5/5 in  all extremities. Sensation intact. Gait not checked.  PSYCHIATRIC: The patient is alert and oriented x 3.  SKIN: No obvious rash, lesion, or ulcer.    LABORATORY PANEL:   CBC Recent Labs  Lab 03/29/17 0612  WBC 13.8*  HGB 14.5  HCT 44.0  PLT 168   ------------------------------------------------------------------------------------------------------------------  Chemistries  Recent Labs  Lab 03/30/17 0514  NA 138  K 3.1*  CL 106  CO2 21*  GLUCOSE 131*  BUN 9  CREATININE 1.00  CALCIUM 7.5*  MG 1.5*  AST 57*  ALT 117*  ALKPHOS 90  BILITOT 2.2*   ------------------------------------------------------------------------------------------------------------------  Cardiac Enzymes No results for input(s): TROPONINI in the last 168 hours. ------------------------------------------------------------------------------------------------------------------  RADIOLOGY:  Mr 3d Recon At Scanner  Result Date: 03/29/2017 CLINICAL DATA:  Acute pancreatitis. Elevated liver function tests. Cholelithiasis. EXAM: MRI ABDOMEN WITHOUT AND WITH CONTRAST (INCLUDING MRCP) TECHNIQUE: Multiplanar multisequence MR imaging of the abdomen was performed both before and after the administration of intravenous contrast. Heavily T2-weighted images of the biliary and pancreatic ducts were obtained, and three-dimensional MRCP images were rendered by post processing. CONTRAST:  14mL MULTIHANCE GADOBENATE DIMEGLUMINE 529 MG/ML IV SOLN COMPARISON:  CT on 03/28/2017 FINDINGS: Lower chest: Bilateral lower lobe atelectasis. Hepatobiliary: No hepatic masses identified. Multiple gallstones are seen. No evidence of gallbladder dilatation or abnormal wall thickening. Common bile duct measures 5 mm, which is within normal limits. No evidence of choledocholithiasis. Pancreas: Diffuse pancreatic swelling and peripancreatic edema, with small amount of fluid in the peripancreatic region which extending inferiorly within the  retroperitoneum. No evidence of pancreatic mass or necrosis. No evidence of pancreatic ductal dilatation or pancreas divisum. No evidence of pseudocysts within the abdomen. Spleen:  Within normal limits  in size and appearance. Adrenals/Urinary Tract: No masses identified. No evidence of hydronephrosis. Stomach/Bowel: Visualized abdominal bowel unremarkable. Vascular/Lymphatic: No pathologically enlarged lymph nodes identified. No abdominal aortic aneurysm. Other:  None. Musculoskeletal:  No suspicious bone lesions identified. IMPRESSION: Mild to moderate acute pancreatitis, with peripancreatic fluid extending inferiorly within the retroperitoneum. No evidence of pancreatic necrosis, mass, or pseudocysts. Cholelithiasis, without definite radiographic evidence of cholecystitis. No evidence of biliary ductal dilatation or choledocholithiasis. Bibasilar atelectasis. Electronically Signed   By: Myles Rosenthal M.D.   On: 03/29/2017 08:33   Mr Abdomen Mrcp Vivien Rossetti Contast  Result Date: 03/29/2017 CLINICAL DATA:  Acute pancreatitis. Elevated liver function tests. Cholelithiasis. EXAM: MRI ABDOMEN WITHOUT AND WITH CONTRAST (INCLUDING MRCP) TECHNIQUE: Multiplanar multisequence MR imaging of the abdomen was performed both before and after the administration of intravenous contrast. Heavily T2-weighted images of the biliary and pancreatic ducts were obtained, and three-dimensional MRCP images were rendered by post processing. CONTRAST:  14mL MULTIHANCE GADOBENATE DIMEGLUMINE 529 MG/ML IV SOLN COMPARISON:  CT on 03/28/2017 FINDINGS: Lower chest: Bilateral lower lobe atelectasis. Hepatobiliary: No hepatic masses identified. Multiple gallstones are seen. No evidence of gallbladder dilatation or abnormal wall thickening. Common bile duct measures 5 mm, which is within normal limits. No evidence of choledocholithiasis. Pancreas: Diffuse pancreatic swelling and peripancreatic edema, with small amount of fluid in the peripancreatic  region which extending inferiorly within the retroperitoneum. No evidence of pancreatic mass or necrosis. No evidence of pancreatic ductal dilatation or pancreas divisum. No evidence of pseudocysts within the abdomen. Spleen:  Within normal limits in size and appearance. Adrenals/Urinary Tract: No masses identified. No evidence of hydronephrosis. Stomach/Bowel: Visualized abdominal bowel unremarkable. Vascular/Lymphatic: No pathologically enlarged lymph nodes identified. No abdominal aortic aneurysm. Other:  None. Musculoskeletal:  No suspicious bone lesions identified. IMPRESSION: Mild to moderate acute pancreatitis, with peripancreatic fluid extending inferiorly within the retroperitoneum. No evidence of pancreatic necrosis, mass, or pseudocysts. Cholelithiasis, without definite radiographic evidence of cholecystitis. No evidence of biliary ductal dilatation or choledocholithiasis. Bibasilar atelectasis. Electronically Signed   By: Myles Rosenthal M.D.   On: 03/29/2017 08:33    EKG:   Orders placed or performed during the hospital encounter of 03/28/17  . EKG 12-Lead  . EKG 12-Lead  . ED EKG  . ED EKG  . EKG    ASSESSMENT AND PLAN:    47 year old male with a history of mild intermittent asthma who presents with abdominal pain.   1. Sepsis: Patient presents with leukocytosis, tachycardia, tachypnea sepsis is due to acute cholecystitis from gallstone pancreatitis Continue Zosyn Continue IV fluids Gi following  2. Acute gallstone pancreatitis with acute cholecystitis and elevated liver function tests: GI  as well as surgery following , conservative management recommended at this time Patient will need  eventually cholecystectomy Clear liquids Due to persistent sx need to use scheduled zofran Continue Zosyn for acute cholecystitis Follow liver function test for a.m. (patient reports that he has a history of elevated liver function tests as well as a familial history of elevated  LFTs.) Lipase trending down  3. Mild intermittent asthma without signs of exacerbation  4. Hypokalemia: Replete and recheck       All the records are reviewed and case discussed with Care Management/Social Workerr. Management plans discussed with the patient, family and they are in agreement.  CODE STATUS: fc  TOTAL TIME TAKING CARE OF THIS PATIENT: 36 minutes.   POSSIBLE D/C IN1-2  DAYS, DEPENDING ON CLINICAL CONDITION.  Note: This dictation was prepared  with Dragon dictation along with smaller phrase technology. Any transcriptional errors that result from this process are unintentional.   Auburn Bilberry M.D on 03/30/2017 at 5:44 PM  Between 7am to 6pm - Pager - [phone] After 6pm go to [email or web address] - password [email or web address] [facility]  [place] [facility] [facility]  Office  [phone]  CC: Primary care physician; Karie Schwalbe, MD

## 2017-03-30 NOTE — Progress Notes (Signed)
Pharmacy Electrolyte Monitoring Consult:  Pharmacy consulted to assist in monitoring and replacing electrolytes in this 47 y.o. male admitted on 03/28/2017 with Abdominal Pain Gallstone pancreatitis  Labs:  Sodium (mmol/L)  Date Value  03/30/2017 138   Potassium (mmol/L)  Date Value  03/30/2017 3.4 (L)   Magnesium (mg/dL)  Date Value  16/10/960402/14/2019 2.4   Calcium (mg/dL)  Date Value  54/09/811902/14/2019 7.5 (L)   Albumin (g/dL)  Date Value  14/78/295602/14/2019 3.1 (L)    Plan:  Mg = 2.4 WNL K = 3.4 is slightly low. Will order KCl 20 mEq PO once.  Recheck electrolytes with AM labs tomorrow.  Cindi CarbonMary M Aniyah Nobis, PharmD Clinical Pharmacist  03/30/2017

## 2017-03-30 NOTE — Progress Notes (Signed)
Melodie Bouillon, MD [address], [place], [place], [address] [address], [address], [place], [place], [address] Phone: [phone]  Fax: [phone]   Subjective:  Patient continues to report abdominal pain.  No nausea or vomiting, tolerating clear liquids.  Abdominal pain is diffuse, sharp, 5/10 compared to 10/10 on presentation.  Objective: Vital signs in last 24 hours: Vitals:   03/29/17 1259 03/29/17 2011 03/30/17 0455 03/30/17 1201  BP:  132/86 (!) 144/76 127/83  Pulse: 98 97 99 99  Resp:  18 18 18   Temp:  98.6 F (37 C) 98.4 F (36.9 C) 98.1 F (36.7 C)  TempSrc:  Oral Oral Oral  SpO2: 95% 97% 96% 93%  Weight:      Height:       Weight change:   Intake/Output Summary (Last 24 hours) at 03/30/2017 1240 Last data filed at 03/30/2017 0935 Gross per 24 hour  Intake 4507.66 ml  Output 1825 ml  Net 2682.66 ml     Exam: Cardiac: +S1, +S2, RRR, No edema Pulm: CTA b/l, Normal Resp Effort Abd: Soft, tender to palpation diffusely, nondistended, no rigidity or guarding, No HSM Skin: Warm, no rashes Neck: Supple, Trachea midline   Lab Results: Lab Results  Component Value Date   WBC 13.8 (H) 03/29/2017   HGB 14.5 03/29/2017   HCT 44.0 03/29/2017   MCV 91.9 03/29/2017   PLT 168 03/29/2017   Micro Results: No results found for this or any previous visit (from the past 240 hour(s)). Studies/Results: Mr 3d Recon At Scanner  Result Date: 03/29/2017 CLINICAL DATA:  Acute pancreatitis. Elevated liver function tests. Cholelithiasis. EXAM: MRI ABDOMEN WITHOUT AND WITH CONTRAST (INCLUDING MRCP) TECHNIQUE: Multiplanar multisequence MR imaging of the abdomen was performed both before and after the administration of intravenous contrast. Heavily T2-weighted images of the biliary and pancreatic ducts were obtained, and three-dimensional MRCP images were rendered by post processing. CONTRAST:  14mL MULTIHANCE GADOBENATE DIMEGLUMINE 529 MG/ML IV SOLN  COMPARISON:  CT on 03/28/2017 FINDINGS: Lower chest: Bilateral lower lobe atelectasis. Hepatobiliary: No hepatic masses identified. Multiple gallstones are seen. No evidence of gallbladder dilatation or abnormal wall thickening. Common bile duct measures 5 mm, which is within normal limits. No evidence of choledocholithiasis. Pancreas: Diffuse pancreatic swelling and peripancreatic edema, with small amount of fluid in the peripancreatic region which extending inferiorly within the retroperitoneum. No evidence of pancreatic mass or necrosis. No evidence of pancreatic ductal dilatation or pancreas divisum. No evidence of pseudocysts within the abdomen. Spleen:  Within normal limits in size and appearance. Adrenals/Urinary Tract: No masses identified. No evidence of hydronephrosis. Stomach/Bowel: Visualized abdominal bowel unremarkable. Vascular/Lymphatic: No pathologically enlarged lymph nodes identified. No abdominal aortic aneurysm. Other:  None. Musculoskeletal:  No suspicious bone lesions identified. IMPRESSION: Mild to moderate acute pancreatitis, with peripancreatic fluid extending inferiorly within the retroperitoneum. No evidence of pancreatic necrosis, mass, or pseudocysts. Cholelithiasis, without definite radiographic evidence of cholecystitis. No evidence of biliary ductal dilatation or choledocholithiasis. Bibasilar atelectasis. Electronically Signed   By: Myles Rosenthal M.D.   On: 03/29/2017 08:33   Mr Abdomen Mrcp Vivien Rossetti Contast  Result Date: 03/29/2017 CLINICAL DATA:  Acute pancreatitis. Elevated liver function tests. Cholelithiasis. EXAM: MRI ABDOMEN WITHOUT AND WITH CONTRAST (INCLUDING MRCP) TECHNIQUE: Multiplanar multisequence MR imaging of the abdomen was performed both before and after the administration of intravenous contrast. Heavily T2-weighted images of the biliary and pancreatic ducts were obtained, and three-dimensional MRCP images were rendered by post processing. CONTRAST:  14mL  MULTIHANCE  GADOBENATE DIMEGLUMINE 529 MG/ML IV SOLN COMPARISON:  CT on 03/28/2017 FINDINGS: Lower chest: Bilateral lower lobe atelectasis. Hepatobiliary: No hepatic masses identified. Multiple gallstones are seen. No evidence of gallbladder dilatation or abnormal wall thickening. Common bile duct measures 5 mm, which is within normal limits. No evidence of choledocholithiasis. Pancreas: Diffuse pancreatic swelling and peripancreatic edema, with small amount of fluid in the peripancreatic region which extending inferiorly within the retroperitoneum. No evidence of pancreatic mass or necrosis. No evidence of pancreatic ductal dilatation or pancreas divisum. No evidence of pseudocysts within the abdomen. Spleen:  Within normal limits in size and appearance. Adrenals/Urinary Tract: No masses identified. No evidence of hydronephrosis. Stomach/Bowel: Visualized abdominal bowel unremarkable. Vascular/Lymphatic: No pathologically enlarged lymph nodes identified. No abdominal aortic aneurysm. Other:  None. Musculoskeletal:  No suspicious bone lesions identified. IMPRESSION: Mild to moderate acute pancreatitis, with peripancreatic fluid extending inferiorly within the retroperitoneum. No evidence of pancreatic necrosis, mass, or pseudocysts. Cholelithiasis, without definite radiographic evidence of cholecystitis. No evidence of biliary ductal dilatation or choledocholithiasis. Bibasilar atelectasis. Electronically Signed   By: Myles RosenthalJohn  Stahl M.D.   On: 03/29/2017 08:33   Medications:  Scheduled Meds: . enoxaparin (LOVENOX) injection  40 mg Subcutaneous Q24H  . ondansetron (ZOFRAN) IV  4 mg Intravenous Q6H  . pantoprazole (PROTONIX) IV  40 mg Intravenous Daily   Continuous Infusions: . sodium chloride 125 mL/hr at 03/30/17 1112  . sodium chloride 10 mL/hr at 03/28/17 1817  . magnesium sulfate 1 - 4 g bolus IVPB 4 g (03/30/17 1051)  . piperacillin-tazobactam (ZOSYN)  IV 3.375 g (03/30/17 1052)   PRN Meds:.sodium chloride,  acetaminophen **OR** acetaminophen, alum & mag hydroxide-simeth, bisacodyl, HYDROcodone-acetaminophen, morphine injection, ondansetron **OR** ondansetron (ZOFRAN) IV, promethazine, senna-docusate   Assessment: 47 year old male admitted with gallstone pancreatitis with MRCP negative for choledocholithiasis and ultrasound reporting cholecystitis   Plan: Patient abdominal pain is due to pancreatitis No signs of infection present at this time As recommended in my consult note, management for pancreatitis includes pain control and IV fluids.  Would recommend lactated Ringer's at 200-250 cc an hour, for the first 24-48 hours of presentation. IV fluids can be decreased once pain improves and patient able to tolerate diet well  Interestingly, pt states him and his family has history of chornically elevated liver enzymes. He does not know if he was specifically diagnosed with Gilbert's. This could explain his elevated Bili with no choledocholithiasis on MRCP Will obtain direct bili to evaluate.  Lipase decreasing and pt is clinically improving as well    LOS: 2 days   Melodie BouillonVarnita Kraig Genis, MD 03/30/2017, 12:40 PM

## 2017-03-31 ENCOUNTER — Inpatient Hospital Stay: Payer: Self-pay

## 2017-03-31 DIAGNOSIS — K851 Biliary acute pancreatitis without necrosis or infection: Secondary | ICD-10-CM | POA: Diagnosis not present

## 2017-03-31 DIAGNOSIS — K8042 Calculus of bile duct with acute cholecystitis without obstruction: Secondary | ICD-10-CM | POA: Diagnosis not present

## 2017-03-31 LAB — COMPREHENSIVE METABOLIC PANEL
ALT: 82 U/L — AB (ref 17–63)
ANION GAP: 10 (ref 5–15)
AST: 40 U/L (ref 15–41)
Albumin: 2.9 g/dL — ABNORMAL LOW (ref 3.5–5.0)
Alkaline Phosphatase: 91 U/L (ref 38–126)
BUN: 9 mg/dL (ref 6–20)
CALCIUM: 7.9 mg/dL — AB (ref 8.9–10.3)
CHLORIDE: 107 mmol/L (ref 101–111)
CO2: 21 mmol/L — AB (ref 22–32)
CREATININE: 0.69 mg/dL (ref 0.61–1.24)
Glucose, Bld: 111 mg/dL — ABNORMAL HIGH (ref 65–99)
Potassium: 3.5 mmol/L (ref 3.5–5.1)
SODIUM: 138 mmol/L (ref 135–145)
Total Bilirubin: 2.9 mg/dL — ABNORMAL HIGH (ref 0.3–1.2)
Total Protein: 6.1 g/dL — ABNORMAL LOW (ref 6.5–8.1)

## 2017-03-31 LAB — LIPASE, BLOOD: LIPASE: 65 U/L — AB (ref 11–51)

## 2017-03-31 MED ORDER — IOPAMIDOL (ISOVUE-300) INJECTION 61%
15.0000 mL | INTRAVENOUS | Status: AC
  Administered 2017-03-31: 15 mL via ORAL

## 2017-03-31 MED ORDER — PANTOPRAZOLE SODIUM 40 MG IV SOLR
40.0000 mg | Freq: Two times a day (BID) | INTRAVENOUS | Status: DC
  Administered 2017-03-31 – 2017-04-05 (×10): 40 mg via INTRAVENOUS
  Filled 2017-03-31 (×10): qty 40

## 2017-03-31 MED ORDER — SIMETHICONE 80 MG PO CHEW
160.0000 mg | CHEWABLE_TABLET | Freq: Four times a day (QID) | ORAL | Status: DC
  Administered 2017-03-31 – 2017-04-05 (×23): 160 mg via ORAL
  Filled 2017-03-31 (×26): qty 2

## 2017-03-31 MED ORDER — IOPAMIDOL (ISOVUE-300) INJECTION 61%
100.0000 mL | Freq: Once | INTRAVENOUS | Status: AC | PRN
Start: 2017-03-31 — End: 2017-03-31
  Administered 2017-03-31: 100 mL via INTRAVENOUS

## 2017-03-31 NOTE — Progress Notes (Addendum)
Warren BouillonVarnita Nino Amano, MD [address], [address], [place], [place], [id number] [address], [address], [place], [place], [address] Phone: [phone]  Fax: [phone]   Subjective: Patient sitting up in chair comfortably.  Reports abdominal pain still present and he is requiring pain medications.  5/10, sharp.  However, states it is overall improved.  He is tolerating more clear liquid diet.  No nausea vomiting.   Objective: Vital signs in last 24 hours: Vitals:   03/30/17 1201 03/30/17 2109 03/31/17 0518 03/31/17 1345  BP: 127/83 138/82 (!) 142/80 136/82  Pulse: 99 (!) 102 87 83  Resp: 18 18 20 17   Temp: 98.1 F (36.7 C) (!) 97.5 F (36.4 C) 97.7 F (36.5 C) (!) 97.3 F (36.3 C)  TempSrc: Oral Oral Oral Oral  SpO2: 93% 95% 96% 93%  Weight:      Height:       Weight change:   Intake/Output Summary (Last 24 hours) at 03/31/2017 1546 Last data filed at 03/31/2017 1400 Gross per 24 hour  Intake 4030.03 ml  Output 575 ml  Net 3455.03 ml     Exam:  Pulm: CTA b/l, Normal Resp Effort Abd: Soft, nondistended, tender to palpation diffusely, No HSM, no rebound or guarding Skin: Warm, no rashes Neck: Supple, Trachea midline   Lab Results: Lab Results  Component Value Date   WBC 13.8 (H) 03/29/2017   HGB 14.5 03/29/2017   HCT 44.0 03/29/2017   MCV 91.9 03/29/2017   PLT 168 03/29/2017   Micro Results: No results found for this or any previous visit (from the past 240 hour(s)). Studies/Results: Ct Abdomen Pelvis W Contrast  Result Date: 03/31/2017 CLINICAL DATA:  Acute pancreatitis EXAM: CT ABDOMEN AND PELVIS WITH CONTRAST TECHNIQUE: Multidetector CT imaging of the abdomen and pelvis was performed using the standard protocol following bolus administration of intravenous contrast. Oral contrast was also administered. CONTRAST:  100mL ISOVUE-300 IOPAMIDOL (ISOVUE-300) INJECTION 61% COMPARISON:  CT abdomen and pelvis March 28, 2017; abdominal MRI March 28, 2017  FINDINGS: Lower chest: There are bilateral pleural effusions. There is airspace consolidation in both posterior lung base regions. Lungs bases elsewhere are clear. Hepatobiliary: There is hepatic steatosis. No focal liver lesions are evident. Gallbladder wall is not appreciably thickened. There is a gallstone within the gallbladder. Fluid tracks from the peripancreatic region to the gallbladder fossa. There is no biliary duct dilatation. Pancreas: The pancreas appears somewhat edematous, stable from recent studies. There is uniform enhancement of the pancreas without evidence of pancreatic necrosis. There is no appreciable pancreatic duct dilatation. There is no well-defined mass or pseudocyst. There is fluid tracking along the contour of the pancreas somewhat diffusely. There is moderate fluid extending between the pancreas and loops of small bowel in the mid abdominal region. There is fluid tracking along the greater curvature of the stomach, similar to recent study. On the left, fluid tracks laterally to the level of the anterior conal fascia, stable. On the right, fluid tracks anteriorly to the level of the gallbladder fossa. Fluid tracks along the course of the second portion of the duodenum, stable. There is fluid abutting the third portion of the duodenum, stable. Fluid tracks more inferiorly into the lower abdomen and pelvis on both sides with mesenteric thickening throughout the mid and lower abdominal regions and into the pelvis with the degree of fluid extending into the pelvis new from prior study. Moderate fluid collects between the urinary bladder and rectum, a finding not present 2 days prior. Spleen:  No splenic lesions evident. Adrenals/Urinary Tract: Adrenals bilaterally appear normal. Kidneys bilaterally show no evident mass or hydronephrosis on either side. There is a 5 x 4 mm calculus in the mid right kidney. No ureteral calculi are evident on either side. Urinary bladder is midline with wall  thickness within normal limits. Stomach/Bowel: Fluid from pancreatitis abuts loops of mid jejunum in the left abdomen with equivocal wall thickening in several areas of jejunum, likely due to inflammation from the pancreatitis. Slight wall thickening in the second portion the duodenum is felt to be due to adjacent pancreatitis. Thickening along the wall of the greater curvature is likewise felt to be due to the fluid from pancreatitis. No bowel obstruction. No evident free air or portal venous air. Vascular/Lymphatic: There is no abdominal aortic aneurysm. There is atherosclerotic calcification in the aorta and right common iliac artery. Major mesenteric vessels appear patent. There is no appreciable adenopathy in the abdomen or pelvis. Reproductive: Prostate and seminal vesicles appear normal in size and contour. No evident pelvic mass. There is a surgical clip in each superior scrotal region. Other: No peripancreatic lesion is evident. No abscess is evident in the abdomen or pelvis. Musculoskeletal: There are no blastic or lytic bone lesions. There is spina bifida occulta at S1. No intramuscular or abdominal wall lesions evident. IMPRESSION: 1. Acute pancreatitis. There is increase in peripancreatic fluid tracking into the lower abdomen and pelvis compared to 2 days prior. The amount of fluid surrounding the pancreas itself is stable. There is no well-defined pancreatic mass or abscess. No pancreatic duct dilatation. Pancreas is mildly edematous. There is no evident pancreatic necrosis. 2. There are areas of gastritis and enteritis due to inflamed peripancreatic fluid abutting these areas. No bowel obstruction. No abscess. No periappendiceal region evident. 3.  Cholelithiasis.  No gallbladder wall thickening. 4. 5 x 4 mm nonobstructing calculus mid right kidney. No hydronephrosis or ureteral calculus on either side. 5.  Pleural effusions with airspace consolidation both lung bases. 6.  Aortoiliac atherosclerosis.  Aortic Atherosclerosis (ICD10-I70.0). Electronically Signed   By: Bretta Bang III M.D.   On: 03/31/2017 14:39   Medications:  Scheduled Meds: . enoxaparin (LOVENOX) injection  40 mg Subcutaneous Q24H  . ondansetron (ZOFRAN) IV  4 mg Intravenous Q6H  . pantoprazole (PROTONIX) IV  40 mg Intravenous Daily  . simethicone  160 mg Oral QID   Continuous Infusions: . sodium chloride 125 mL/hr at 03/31/17 1101  . sodium chloride 10 mL/hr at 03/28/17 1817   PRN Meds:.sodium chloride, acetaminophen **OR** acetaminophen, alum & mag hydroxide-simeth, bisacodyl, HYDROcodone-acetaminophen, morphine injection, ondansetron **OR** ondansetron (ZOFRAN) IV, promethazine, senna-docusate   Assessment: 47 year old male with gallstone pancreatitis and cholecystitis with no choledocholithiasis present on MRCP   Plan: Abdominal pain is continuing to improve Patient is tolerating a clear liquid diet, and this can be advanced in 1-2 days as pain improves Continue IV fluids and pain control Can decrease his IV fluid rate as he is tolerating more liquids No indication for ERCP at this time His bilirubin fractionation, shows that the elevated total bilirubin is due to indirect elevation of bilirubin.  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   Further management of cholecystitis and pancreatitis as per surgery team GI service will sign off Please page with any questions   LOS: 3 days   Warren Bouillon, MD 03/31/2017, 3:46 PM

## 2017-03-31 NOTE — Progress Notes (Signed)
[facility] [place] [facility]   PATIENT NAME: Warren David    MR#:  [id number]  DATE OF BIRTH:  08/31/1970  SUBJECTIVE:  CHIEF COMPLAINT:   Still continues to complain of sharp pain  REVIEW OF SYSTEMS:  CONSTITUTIONAL: No fever, fatigue or weakness.  EYES: No blurred or double vision.  EARS, NOSE, AND THROAT: No tinnitus or ear pain.  RESPIRATORY: No cough, shortness of breath, wheezing or hemoptysis.  CARDIOVASCULAR: No chest pain, orthopnea, edema.  GASTROINTESTINAL: postive nausea, vomiting, diarrhea; has epigastric abdominal pain.  GENITOURINARY: No dysuria, hematuria.  ENDOCRINE: No polyuria, nocturia,  HEMATOLOGY: No anemia, easy bruising or bleeding SKIN: No rash or lesion. MUSCULOSKELETAL: No joint pain or arthritis.   NEUROLOGIC: No tingling, numbness, weakness.  PSYCHIATRY: No anxiety or depression.   DRUG ALLERGIES:   Allergies  Allergen Reactions  . Fluticasone-Salmeterol     REACTION: palpatations    VITALS:  Blood pressure 136/82, pulse 83, temperature (!) 97.3 F (36.3 C), temperature source Oral, resp. rate 17, height 5\' 7"  (1.702 m), weight 158 lb (71.7 kg), SpO2 93 %.  PHYSICAL EXAMINATION:  GENERAL:  47 y.o.-year-old patient lying in the bed with no acute distress.  EYES: Pupils equal, round, reactive to light and accommodation. No scleral icterus. Extraocular muscles intact.  HEENT: Head atraumatic, normocephalic. Oropharynx and nasopharynx clear.  NECK:  Supple, no jugular venous distention. No thyroid enlargement, no tenderness.  LUNGS: Normal breath sounds bilaterally, no wheezing, rales,rhonchi or crepitation. No use of accessory muscles of respiration.  CARDIOVASCULAR: S1, S2 normal. No murmurs, rubs, or gallops.  ABDOMEN: Soft, positive distended positive tenderness. Bowel sounds present. EXTREMITIES: No pedal edema, cyanosis, or clubbing.  NEUROLOGIC: Cranial nerves II through XII are intact. Muscle strength 5/5  in all extremities. Sensation intact. Gait not checked.  PSYCHIATRIC: The patient is alert and oriented x 3.  SKIN: No obvious rash, lesion, or ulcer.    LABORATORY PANEL:   CBC Recent Labs  Lab 03/29/17 0612  WBC 13.8*  HGB 14.5  HCT 44.0  PLT 168   ------------------------------------------------------------------------------------------------------------------  Chemistries  Recent Labs  Lab 03/30/17 1810 03/31/17 0416  NA  --  138  K 3.4* 3.5  CL  --  107  CO2  --  21*  GLUCOSE  --  111*  BUN  --  9  CREATININE  --  0.69  CALCIUM  --  7.9*  MG 2.4  --   AST  --  40  ALT  --  82*  ALKPHOS  --  91  BILITOT  --  2.9*   ------------------------------------------------------------------------------------------------------------------  Cardiac Enzymes No results for input(s): TROPONINI in the last 168 hours. ------------------------------------------------------------------------------------------------------------------  RADIOLOGY:  No results found.  EKG:   Orders placed or performed during the hospital encounter of 03/28/17  . EKG 12-Lead  . EKG 12-Lead  . ED EKG  . ED EKG  . EKG    ASSESSMENT AND PLAN:    47 year old male with a history of mild intermittent asthma who presents with abdominal pain.   1. Sepsis: Patient presents with leukocytosis, tachycardia, tachypnea sepsis is due to possible gallstone pancreatitis Discontinue IV antibitotics Continue IV fluids Gi following  2. Acute gallstone pancreatitis with acute cholecystitis and elevated liver function tests: GI  as well as surgery following , conservative management recommended at this time Clear liquids Due to persistent sx need to use scheduled zofran Lipase normal continues to have pain CT scan of the abdomen  has been ordered  3. Mild intermittent asthma without signs of exacerbation  4. Hypokalemia: Replete and recheck       All the records are reviewed and case  discussed with Care Management/Social Workerr. Management plans discussed with the patient, family and they are in agreement.  CODE STATUS: fc  TOTAL TIME TAKING CARE OF THIS PATIENT: 34 minutes.   POSSIBLE D/C IN1-2  DAYS, DEPENDING ON CLINICAL CONDITION.  Note: This dictation was prepared with Dragon dictation along with smaller phrase technology. Any transcriptional errors that result from this process are unintentional.   Auburn Bilberry M.D on 03/31/2017 at 2:30 PM  Between 7am to 6pm - Pager - [phone] After 6pm go to [email or web address] - password [email or web address] [facility]  [facility]  Office  [phone]  CC: Primary care physician; Karie Schwalbe, MD

## 2017-03-31 NOTE — Progress Notes (Signed)
Pharmacy Antibiotic Note  Warren David is a 47 y.o. male admitted on 03/28/2017 with sepsis secondary to gallstone pancreatitis and potential cholecystitis .  Pharmacy has been consulted for Zosyn dosing.  Plan: Will continue Zosyn EI 3.375g VI Q8hr.  Will discuss discontinuation of antibiotics with MD with plans to resume antibiotics if patient develops signs of extrapancreatic infection.   Height: 5\' 7"  (170.2 cm) Weight: 158 lb (71.7 kg) IBW/kg (Calculated) : 66.1  Temp (24hrs), Avg:97.8 F (36.6 C), Min:97.5 F (36.4 C), Max:98.1 F (36.7 C)  Recent Labs  Lab 03/28/17 0838 03/29/17 0612 03/29/17 2348 03/30/17 0514 03/31/17 0416  WBC 18.0* 13.8*  --   --   --   CREATININE 1.23 0.87 0.86 1.00 0.69    Estimated Creatinine Clearance: 106.7 mL/min (by C-G formula based on SCr of 0.69 mg/dL).    Allergies  Allergen Reactions  . Fluticasone-Salmeterol     REACTION: palpatations    Antimicrobials this admission: Zosyn 2/12 >>   Dose adjustments this admission: N/A  Microbiology results: N/A  Thank you for allowing pharmacy to be a part of this patient's care.  Warren David, Warren David 03/31/2017 11:45 AM

## 2017-03-31 NOTE — Progress Notes (Addendum)
Pharmacy Electrolyte Monitoring Consult:  Pharmacy consulted to assist in monitoring and replacing electrolytes in this 47 y.o. male admitted on 03/28/2017 with Abdominal Pain Gallstone pancreatitis  Labs:  Sodium (mmol/L)  Date Value  03/31/2017 138   Potassium (mmol/L)  Date Value  03/31/2017 3.5   Magnesium (mg/dL)  Date Value  69/62/952802/14/2019 2.4   Calcium (mg/dL)  Date Value  41/32/440102/15/2019 7.9 (L)   Albumin (g/dL)  Date Value  02/72/536602/15/2019 2.9 (L)    Plan:  Electrolytes are WNL. Will continue to follow as patient may be at risk for refeeding syndrome upon resuming nutrition. Probably not necessary to check phosphorus until plans for resuming nutrition are finalized.  Recheck electrolytes with AM labs tomorrow.  Luisa Harthristy, Kenzley Ke D, PharmD Clinical Pharmacist  03/31/2017

## 2017-03-31 NOTE — Progress Notes (Signed)
CC: Pancreatitis Subjective: Patient reports he continues to have abdominal pain and distention.  He states the pain might be a little bit better than the day before but is still fairly significant.  Objective: Vital signs in last 24 hours: Temp:  [97.5 F (36.4 C)-97.7 F (36.5 C)] 97.7 F (36.5 C) (02/15 0518) Pulse Rate:  [87-102] 87 (02/15 0518) Resp:  [18-20] 20 (02/15 0518) BP: (138-142)/(80-82) 142/80 (02/15 0518) SpO2:  [95 %-96 %] 96 % (02/15 0518) Last BM Date: 03/28/17  Intake/Output from previous day: 02/14 0701 - 02/15 0700 In: 3381.7 [P.O.:180; I.V.:3101.7; IV Piggyback:100] Out: 1300 [Urine:1300] Intake/Output this shift: Total I/O In: 878.3 [P.O.:120; I.V.:758.3] Out: 175 [Urine:175]  Physical exam:  General: No acute distress Chest: Clear to auscultation Heart: Regular rate and rhythm Abdomen: Soft, tender to palpation in all quadrants, moderately distended.  Lab Results: CBC  Recent Labs    03/29/17 0612  WBC 13.8*  HGB 14.5  HCT 44.0  PLT 168   BMET Recent Labs    03/30/17 0514 03/30/17 1810 03/31/17 0416  NA 138  --  138  K 3.1* 3.4* 3.5  CL 106  --  107  CO2 21*  --  21*  GLUCOSE 131*  --  111*  BUN 9  --  9  CREATININE 1.00  --  0.69  CALCIUM 7.5*  --  7.9*   PT/INR No results for input(s): LABPROT, INR in the last 72 hours. ABG No results for input(s): PHART, HCO3 in the last 72 hours.  Invalid input(s): PCO2, PO2  Studies/Results: No results found.  Anti-infectives: Anti-infectives (From admission, onward)   Start     Dose/Rate Route Frequency Ordered Stop   03/28/17 1800  piperacillin-tazobactam (ZOSYN) IVPB 3.375 g     3.375 g 12.5 mL/hr over 240 Minutes Intravenous Every 8 hours 03/28/17 1159     03/28/17 1100  piperacillin-tazobactam (ZOSYN) IVPB 3.375 g     3.375 g 100 mL/hr over 30 Minutes Intravenous  Once 03/28/17 1055 03/28/17 1208      Assessment/Plan:  47 year old male with pancreatitis presumably from  a biliary source.  Agree with plan from the primary team of repeating cross-sectional imaging.  Discussed the recent lab results with the patient and reiterated with the patient that his exam is actually a little bit better today.  He is frustrated with how long it is taking to improve.  Still currently no plans for surgical intervention in the setting of significant pancreatitis.  General surgery will continue to follow with you.  Tacey Dimaggio T. Tonita CongWoodham, MD, [facility] General Surgeon [facility]  Day [facility] [phone] Night ASC[facility] [phone]-[phone] 03/31/2017

## 2017-04-01 ENCOUNTER — Inpatient Hospital Stay: Payer: Self-pay

## 2017-04-01 DIAGNOSIS — K851 Biliary acute pancreatitis without necrosis or infection: Secondary | ICD-10-CM | POA: Diagnosis not present

## 2017-04-01 DIAGNOSIS — K8042 Calculus of bile duct with acute cholecystitis without obstruction: Secondary | ICD-10-CM | POA: Diagnosis not present

## 2017-04-01 LAB — BASIC METABOLIC PANEL
ANION GAP: 11 (ref 5–15)
BUN: 11 mg/dL (ref 6–20)
CALCIUM: 7.6 mg/dL — AB (ref 8.9–10.3)
CO2: 21 mmol/L — ABNORMAL LOW (ref 22–32)
Chloride: 104 mmol/L (ref 101–111)
Creatinine, Ser: 0.65 mg/dL (ref 0.61–1.24)
Glucose, Bld: 98 mg/dL (ref 65–99)
POTASSIUM: 3.2 mmol/L — AB (ref 3.5–5.1)
SODIUM: 136 mmol/L (ref 135–145)

## 2017-04-01 LAB — GLUCOSE, CAPILLARY
Glucose-Capillary: 108 mg/dL — ABNORMAL HIGH (ref 65–99)
Glucose-Capillary: 133 mg/dL — ABNORMAL HIGH (ref 65–99)
Glucose-Capillary: 83 mg/dL (ref 65–99)

## 2017-04-01 LAB — MAGNESIUM: MAGNESIUM: 2.1 mg/dL (ref 1.7–2.4)

## 2017-04-01 LAB — PHOSPHORUS: PHOSPHORUS: 1.6 mg/dL — AB (ref 2.5–4.6)

## 2017-04-01 MED ORDER — SODIUM CHLORIDE 0.9% FLUSH: 10.0000 mL | INTRAVENOUS | Status: DC | PRN

## 2017-04-01 MED ORDER — POTASSIUM CHLORIDE 10 MEQ/100ML IV SOLN
10.0000 meq | INTRAVENOUS | Status: AC
  Administered 2017-04-01 (×4): 10 meq via INTRAVENOUS
  Filled 2017-04-01 (×4): qty 100

## 2017-04-01 MED ORDER — INSULIN ASPART 100 UNIT/ML ~~LOC~~ SOLN
0.0000 [IU] | SUBCUTANEOUS | Status: DC
  Administered 2017-04-02 (×2): 2 [IU] via SUBCUTANEOUS
  Administered 2017-04-02: 1 [IU] via SUBCUTANEOUS
  Administered 2017-04-02: 2 [IU] via SUBCUTANEOUS
  Administered 2017-04-02 – 2017-04-03 (×7): 1 [IU] via SUBCUTANEOUS
  Administered 2017-04-03: 2 [IU] via SUBCUTANEOUS
  Administered 2017-04-04 (×2): 1 [IU] via SUBCUTANEOUS
  Administered 2017-04-04: 2 [IU] via SUBCUTANEOUS
  Administered 2017-04-04 – 2017-04-05 (×5): 1 [IU] via SUBCUTANEOUS
  Administered 2017-04-05: 2 [IU] via SUBCUTANEOUS
  Filled 2017-04-01 (×21): qty 1

## 2017-04-01 MED ORDER — TRACE MINERALS CR-CU-MN-SE-ZN 10-1000-500-60 MCG/ML IV SOLN
INTRAVENOUS | Status: AC
  Administered 2017-04-01: 18:00:00 via INTRAVENOUS
  Filled 2017-04-01: qty 960

## 2017-04-01 MED ORDER — SODIUM CHLORIDE 0.9% FLUSH
10.0000 mL | Freq: Two times a day (BID) | INTRAVENOUS | Status: DC
  Administered 2017-04-01 – 2017-04-02 (×3): 10 mL
  Administered 2017-04-03: 20 mL
  Administered 2017-04-03 – 2017-04-05 (×4): 10 mL

## 2017-04-01 NOTE — Progress Notes (Signed)
CC: Pancreatitis Subjective: Patient reports continued abdominal discomfort and distention.  No real changes overnight.  Still not able to tolerate anything by mouth secondary to his pancreatitis pain.  Objective: Vital signs in last 24 hours: Temp:  [97.6 F (36.4 C)-99.5 F (37.5 C)] 99.5 F (37.5 C) (02/16 1244) Pulse Rate:  [83-93] 93 (02/16 1244) Resp:  [16-20] 18 (02/16 1244) BP: (136-143)/(76-85) 140/85 (02/16 1244) SpO2:  [93 %-95 %] 93 % (02/16 1244) Last BM Date: 03/28/17  Intake/Output from previous day: 02/15 0701 - 02/16 0700 In: 878.3 [P.O.:120; I.V.:758.3] Out: 1075 [Urine:1075] Intake/Output this shift: Total I/O In: 3504 [I.V.:3254; IV Piggyback:250] Out: 325 [Urine:325]  Physical exam:  General: No acute distress Chest: Clear to all station Heart: Regular rhythm Abdomen: Soft, moderately distended, tender to palpation in all 4 quadrants worse in the upper abdomen.  Lab Results: CBC  No results for input(s): WBC, HGB, HCT, PLT in the last 72 hours. BMET Recent Labs    03/31/17 0416 04/01/17 0457  NA 138 136  K 3.5 3.2*  CL 107 104  CO2 21* 21*  GLUCOSE 111* 98  BUN 9 11  CREATININE 0.69 0.65  CALCIUM 7.9* 7.6*   PT/INR No results for input(s): LABPROT, INR in the last 72 hours. ABG No results for input(s): PHART, HCO3 in the last 72 hours.  Invalid input(s): PCO2, PO2  Studies/Results: Dg Abd 1 View  Result Date: 04/01/2017 CLINICAL DATA:  Gallstone pancreatitis with persistent abdominal pain EXAM: ABDOMEN - 1 VIEW COMPARISON:  CT scan 03/31/2017 FINDINGS: Gaseous distention of small bowel in the left upper quadrant noted, measuring up to 3.8 cm diameter. No overt obstructive pattern at this time. Gas and contrast material noted in a nondilated right colon. No unexpected abdominopelvic calcification. Visualized bony anatomy unremarkable. IMPRESSION: Gaseous distention of small bowel in the left upper quadrant may be related to adynamic or  inflammatory ileus. No overt obstructive pattern at this time but serial x-rays may be warranted to exclude progression. Electronically Signed   By: Kennith Center M.D.   On: 04/01/2017 11:10   Ct Abdomen Pelvis W Contrast  Result Date: 03/31/2017 CLINICAL DATA:  Acute pancreatitis EXAM: CT ABDOMEN AND PELVIS WITH CONTRAST TECHNIQUE: Multidetector CT imaging of the abdomen and pelvis was performed using the standard protocol following bolus administration of intravenous contrast. Oral contrast was also administered. CONTRAST:  ISOVUE-300 IOPAMIDOL (ISOVUE-300) INJECTION 61% COMPARISON:  CT abdomen and pelvis March 28, 2017; abdominal MRI March 28, 2017 FINDINGS: Lower chest: There are bilateral pleural effusions. There is airspace consolidation in both posterior lung base regions. Lungs bases elsewhere are clear. Hepatobiliary: There is hepatic steatosis. No focal liver lesions are evident. Gallbladder wall is not appreciably thickened. There is a gallstone within the gallbladder. Fluid tracks from the peripancreatic region to the gallbladder fossa. There is no biliary duct dilatation. Pancreas: The pancreas appears somewhat edematous, stable from recent studies. There is uniform enhancement of the pancreas without evidence of pancreatic necrosis. There is no appreciable pancreatic duct dilatation. There is no well-defined mass or pseudocyst. There is fluid tracking along the contour of the pancreas somewhat diffusely. There is moderate fluid extending between the pancreas and loops of small bowel in the mid abdominal region. There is fluid tracking along the greater curvature of the stomach, similar to recent study. On the left, fluid tracks laterally to the level of the anterior conal fascia, stable. On the right, fluid tracks anteriorly to the level of the gallbladder  fossa. Fluid tracks along the course of the second portion of the duodenum, stable. There is fluid abutting the third portion of the  duodenum, stable. Fluid tracks more inferiorly into the lower abdomen and pelvis on both sides with mesenteric thickening throughout the mid and lower abdominal regions and into the pelvis with the degree of fluid extending into the pelvis new from prior study. Moderate fluid collects between the urinary bladder and rectum, a finding not present 2 days prior. Spleen: No splenic lesions evident. Adrenals/Urinary Tract: Adrenals bilaterally appear normal. Kidneys bilaterally show no evident mass or hydronephrosis on either side. There is a 5 x 4 mm calculus in the mid right kidney. No ureteral calculi are evident on either side. Urinary bladder is midline with wall thickness within normal limits. Stomach/Bowel: Fluid from pancreatitis abuts loops of mid jejunum in the left abdomen with equivocal wall thickening in several areas of jejunum, likely due to inflammation from the pancreatitis. Slight wall thickening in the second portion the duodenum is felt to be due to adjacent pancreatitis. Thickening along the wall of the greater curvature is likewise felt to be due to the fluid from pancreatitis. No bowel obstruction. No evident free air or portal venous air. Vascular/Lymphatic: There is no abdominal aortic aneurysm. There is atherosclerotic calcification in the aorta and right common iliac artery. Major mesenteric vessels appear patent. There is no appreciable adenopathy in the abdomen or pelvis. Reproductive: Prostate and seminal vesicles appear normal in size and contour. No evident pelvic mass. There is a surgical clip in each superior scrotal region. Other: No peripancreatic lesion is evident. No abscess is evident in the abdomen or pelvis. Musculoskeletal: There are no blastic or lytic bone lesions. There is spina bifida occulta at S1. No intramuscular or abdominal wall lesions evident. IMPRESSION: 1. Acute pancreatitis. There is increase in peripancreatic fluid tracking into the lower abdomen and pelvis  compared to 2 days prior. The amount of fluid surrounding the pancreas itself is stable. There is no well-defined pancreatic mass or abscess. No pancreatic duct dilatation. Pancreas is mildly edematous. There is no evident pancreatic necrosis. 2. There are areas of gastritis and enteritis due to inflamed peripancreatic fluid abutting these areas. No bowel obstruction. No abscess. No periappendiceal region evident. 3.  Cholelithiasis.  No gallbladder wall thickening. 4. 5 x 4 mm nonobstructing calculus mid right kidney. No hydronephrosis or ureteral calculus on either side. 5.  Pleural effusions with airspace consolidation both lung bases. 6.  Aortoiliac atherosclerosis. Aortic Atherosclerosis (ICD10-I70.0). Electronically Signed   By: Bretta BangWilliam  Woodruff III M.D.   On: 03/31/2017 14:39    Anti-infectives: Anti-infectives (From admission, onward)   Start     Dose/Rate Route Frequency Ordered Stop   03/28/17 1800  piperacillin-tazobactam (ZOSYN) IVPB 3.375 g  Status:  Discontinued     3.375 g 12.5 mL/hr over 240 Minutes Intravenous Every 8 hours 03/28/17 1159 03/31/17 1345   03/28/17 1100  piperacillin-tazobactam (ZOSYN) IVPB 3.375 g     3.375 g 100 mL/hr over 30 Minutes Intravenous  Once 03/28/17 1055 03/28/17 1208      Assessment/Plan:  47 year old male with pancreatitis.  Appears to still be having sequelae from the pancreatitis with pancreatic ascites throughout his abdomen.  No evidence of abscess, pseudocyst, cholecystitis on most recent imaging.  Had a long conversation with patient's morning about parenteral nutrition.  Plan had been to discuss this with the primary team who appears to have already ordered the PICC line and parenteral nutrition.  General surgery will continue to follow with you.  Dennis Killilea T. Tonita Cong, MD, [facility] General Surgeon [place] [facility]  Day [facility] [phone] Night [facility] [phone] 04/01/2017

## 2017-04-01 NOTE — Progress Notes (Signed)
Brief Nutrition Note  Consult received for parenteral nutrition. Central Access: PICC (awaiting placement)  Pharmacy to initiate Clinimix 5%AA/15%Dextrose at 3440ml/hr with MVI and Trace Minerals at this time.  Full assessment and further recommendations to follow.  Admitting Dx: Choledocholithiasis with acute cholecystitis [K80.42] Pancreatitis [K85.90] Acute gallstone pancreatitis [K85.10]  Body mass index is 24.75 kg/m. Pt meets criteria for normal weight based on current BMI.  Labs:  Recent Labs  Lab 03/30/17 0514 03/30/17 1810 03/31/17 0416 04/01/17 0457  NA 138  --  138 136  K 3.1* 3.4* 3.5 3.2*  CL 106  --  107 104  CO2 21*  --  21* 21*  BUN 9  --  9 11  CREATININE 1.00  --  0.69 0.65  CALCIUM 7.5*  --  7.9* 7.6*  MG 1.5* 2.4  --  2.1    Dionne AnoWilliam M. Malai Lady, MS, RD LDN Inpatient Clinical Dietitian Pager [phone]

## 2017-04-01 NOTE — Progress Notes (Signed)
PHARMACY - ADULT TOTAL PARENTERAL NUTRITION CONSULT NOTE   Pharmacy Consult for TPN Indication: severe pancreatitis  Patient Measurements: Height: 5\' 7"  (170.2 cm) Weight: 158 lb (71.7 kg) IBW/kg (Calculated) : 66.1 TPN AdjBW (KG): 71.7 Body mass index is 24.75 kg/m.  Assessment:   GI:  Endo:  Insulin requirements in the past 24 hours:  Lytes: Renal: Pulm: Cards:  Hepatobil: Neuro: ID:  TPN Access: 2/16 awaiting PICC placement TPN start date: Nutritional Goals  kCal: Protein:  Fluid:  Current Nutrition:   Plan: Once PICC line placed, will order TPN Clinimix 5/15 E TPN at 40 mL/hr. Hold 20% lipid emulsion for first 7 days for ICU patients per ASPEN guidelines Add MVI, trace elements to TPN q4h SSI and adjust as needed TPN labs ordered per protocol  K = 3.2, received 30 mEq IV KCl. Mg 2.1 WNL  Cindi CarbonMary M Ancil Dewan, PharmD 04/01/2017,2:35 PM

## 2017-04-01 NOTE — Progress Notes (Signed)
Pharmacy Electrolyte Monitoring Consult:  Pharmacy consulted to assist in monitoring and replacing electrolytes in this 47 y.o. male admitted on 03/28/2017 with Abdominal Pain Gallstone pancreatitis  Labs:  Sodium (mmol/L)  Date Value  04/01/2017 136   Potassium (mmol/L)  Date Value  04/01/2017 3.2 (L)   Magnesium (mg/dL)  Date Value  40/98/119102/16/2019 2.1   Calcium (mg/dL)  Date Value  47/82/956202/16/2019 7.6 (L)   Albumin (g/dL)  Date Value  13/08/657802/15/2019 2.9 (L)    Plan:  MD ordered KCl 10 mEq IV x 4 doses. Will recheck K in am.   Demetrius CharityJames,Josanna Hefel D, PharmD Clinical Pharmacist  04/01/2017

## 2017-04-01 NOTE — Progress Notes (Addendum)
[employer] -  at [facility]   PATIENT NAME: Warren David    MR#:  [id number]  DATE OF BIRTH:  05/14/1970  SUBJECTIVE:   Still with significant abdominal pain and nausea  REVIEW OF SYSTEMS:    Review of Systems  Constitutional: Negative for fever, chills weight loss HENT: Negative for ear pain, nosebleeds, congestion, facial swelling, rhinorrhea, neck pain, neck stiffness and ear discharge.   Respiratory: Negative for cough, shortness of breath, wheezing  Cardiovascular: Negative for chest pain, palpitations and leg swelling.  Gastrointestinal: Negative for heartburn, positive abdominal pain, no vomiting, diarrhea or consitpation Genitourinary: Negative for dysuria, urgency, frequency, hematuria Musculoskeletal: Negative for back pain or joint pain Neurological: Negative for dizziness, seizures, syncope, focal weakness,  numbness and headaches.  Hematological: Does not bruise/bleed easily.  Psychiatric/Behavioral: Negative for hallucinations, confusion, dysphoric mood    Tolerating Diet: npo      DRUG ALLERGIES:   Allergies  Allergen Reactions  . Fluticasone-Salmeterol     REACTION: palpatations    VITALS:  Blood pressure (!) 143/76, pulse 83, temperature 98.7 F (37.1 C), temperature source Oral, resp. rate 16, height 5\' 7"  (1.702 m), weight 71.7 kg (158 lb), SpO2 93 %.  PHYSICAL EXAMINATION:  Constitutional: Appears well-developed and well-nourished. No distress. HENT: Normocephalic. Marland Kitchen. Oropharynx is clear and moist.  Eyes: Conjunctivae and EOM are normal. PERRLA, no scleral icterus.  Neck: Normal ROM. Neck supple. No JVD. No tracheal deviation. CVS: RRR, S1/S2 +, no murmurs, no gallops, no carotid bruit.  Pulmonary: Effort and breath sounds normal, no stridor, rhonchi, wheezes, rales.  Abdominal: Slightly distended with tenderness no rebound or guarding   Musculoskeletal: Normal range of motion. No edema and no tenderness.  Neuro: Alert.  CN 2-12 grossly intact. No focal deficits. Skin: Skin is warm and dry. No rash noted. Psychiatric: Normal mood and affect.      LABORATORY PANEL:   CBC Recent Labs  Lab 03/29/17 0612  WBC 13.8*  HGB 14.5  HCT 44.0  PLT 168   ------------------------------------------------------------------------------------------------------------------  Chemistries  Recent Labs  Lab 03/30/17 1810 03/31/17 0416 04/01/17 0457  NA  --  138 136  K 3.4* 3.5 3.2*  CL  --  107 104  CO2  --  21* 21*  GLUCOSE  --  111* 98  BUN  --  9 11  CREATININE  --  0.69 0.65  CALCIUM  --  7.9* 7.6*  MG 2.4  --   --   AST  --  40  --   ALT  --  82*  --   ALKPHOS  --  91  --   BILITOT  --  2.9*  --    ------------------------------------------------------------------------------------------------------------------  Cardiac Enzymes No results for input(s): TROPONINI in the last 168 hours. ------------------------------------------------------------------------------------------------------------------  RADIOLOGY:  Ct Abdomen Pelvis W Contrast  Result Date: 03/31/2017 CLINICAL DATA:  Acute pancreatitis EXAM: CT ABDOMEN AND PELVIS WITH CONTRAST TECHNIQUE: Multidetector CT imaging of the abdomen and pelvis was performed using the standard protocol following bolus administration of intravenous contrast. Oral contrast was also administered. CONTRAST:  100mL ISOVUE-300 IOPAMIDOL (ISOVUE-300) INJECTION 61% COMPARISON:  CT abdomen and pelvis March 28, 2017; abdominal MRI March 28, 2017 FINDINGS: Lower chest: There are bilateral pleural effusions. There is airspace consolidation in both posterior lung base regions. Lungs bases elsewhere are clear. Hepatobiliary: There is hepatic steatosis. No focal liver lesions are evident. Gallbladder wall is not appreciably thickened. There is a gallstone within the gallbladder. Fluid  tracks from the peripancreatic region to the gallbladder fossa. There is no biliary duct  dilatation. Pancreas: The pancreas appears somewhat edematous, stable from recent studies. There is uniform enhancement of the pancreas without evidence of pancreatic necrosis. There is no appreciable pancreatic duct dilatation. There is no well-defined mass or pseudocyst. There is fluid tracking along the contour of the pancreas somewhat diffusely. There is moderate fluid extending between the pancreas and loops of small bowel in the mid abdominal region. There is fluid tracking along the greater curvature of the stomach, similar to recent study. On the left, fluid tracks laterally to the level of the anterior conal fascia, stable. On the right, fluid tracks anteriorly to the level of the gallbladder fossa. Fluid tracks along the course of the second portion of the duodenum, stable. There is fluid abutting the third portion of the duodenum, stable. Fluid tracks more inferiorly into the lower abdomen and pelvis on both sides with mesenteric thickening throughout the mid and lower abdominal regions and into the pelvis with the degree of fluid extending into the pelvis new from prior study. Moderate fluid collects between the urinary bladder and rectum, a finding not present 2 days prior. Spleen: No splenic lesions evident. Adrenals/Urinary Tract: Adrenals bilaterally appear normal. Kidneys bilaterally show no evident mass or hydronephrosis on either side. There is a 5 x 4 mm calculus in the mid right kidney. No ureteral calculi are evident on either side. Urinary bladder is midline with wall thickness within normal limits. Stomach/Bowel: Fluid from pancreatitis abuts loops of mid jejunum in the left abdomen with equivocal wall thickening in several areas of jejunum, likely due to inflammation from the pancreatitis. Slight wall thickening in the second portion the duodenum is felt to be due to adjacent pancreatitis. Thickening along the wall of the greater curvature is likewise felt to be due to the fluid from  pancreatitis. No bowel obstruction. No evident free air or portal venous air. Vascular/Lymphatic: There is no abdominal aortic aneurysm. There is atherosclerotic calcification in the aorta and right common iliac artery. Major mesenteric vessels appear patent. There is no appreciable adenopathy in the abdomen or pelvis. Reproductive: Prostate and seminal vesicles appear normal in size and contour. No evident pelvic mass. There is a surgical clip in each superior scrotal region. Other: No peripancreatic lesion is evident. No abscess is evident in the abdomen or pelvis. Musculoskeletal: There are no blastic or lytic bone lesions. There is spina bifida occulta at S1. No intramuscular or abdominal wall lesions evident. IMPRESSION: 1. Acute pancreatitis. There is increase in peripancreatic fluid tracking into the lower abdomen and pelvis compared to 2 days prior. The amount of fluid surrounding the pancreas itself is stable. There is no well-defined pancreatic mass or abscess. No pancreatic duct dilatation. Pancreas is mildly edematous. There is no evident pancreatic necrosis. 2. There are areas of gastritis and enteritis due to inflamed peripancreatic fluid abutting these areas. No bowel obstruction. No abscess. No periappendiceal region evident. 3.  Cholelithiasis.  No gallbladder wall thickening. 4. 5 x 4 mm nonobstructing calculus mid right kidney. No hydronephrosis or ureteral calculus on either side. 5.  Pleural effusions with airspace consolidation both lung bases. 6.  Aortoiliac atherosclerosis. Aortic Atherosclerosis (ICD10-I70.0). Electronically Signed   By: Bretta Bang III M.D.   On: 03/31/2017 14:39     ASSESSMENT AND PLAN:   47 year old male with mild intermittent asthma who presented with abdominal pain.   1. Acute pancreatitis with biliary source: Patient continues to  have abdominal pain Continue nothing by mouth Continue supportive management with when necessary antiemetics and pain  medications PICC line for nutrition MRCP without evidence of acute cholecystitis or choledocholithiasis No surgical intervention Appreciate surgery evaluation KUB for abdominal distension  2. Mild intermittent asthma without signs of exacerbation  3. Hypokalemia: Replete and recheck in a.m.    Management plans discussed with the patient and he is in agreement.  CODE STATUS: Full  TOTAL TIME TAKING CARE OF THIS PATIENT: 23 minutes.     POSSIBLE D/C 3-5 days, DEPENDING ON CLINICAL CONDITION.   Tyr Franca M.D on 04/01/2017 at 10:55 AM  Between 7am to 6pm - Pager - [phone] After 6pm go to [email or web address] - password [email or web address]  [facility]  Office  [phone]  CC: Primary care physician; Karie Schwalbe, MD  Note: This dictation was prepared with Dragon dictation along with smaller phrase technology. Any transcriptional errors that result from this process are unintentional.

## 2017-04-02 ENCOUNTER — Inpatient Hospital Stay: Payer: Self-pay

## 2017-04-02 LAB — GLUCOSE, CAPILLARY
GLUCOSE-CAPILLARY: 144 mg/dL — AB (ref 65–99)
GLUCOSE-CAPILLARY: 159 mg/dL — AB (ref 65–99)
Glucose-Capillary: 151 mg/dL — ABNORMAL HIGH (ref 65–99)
Glucose-Capillary: 153 mg/dL — ABNORMAL HIGH (ref 65–99)
Glucose-Capillary: 133 mg/dL — ABNORMAL HIGH (ref 65–99)
Glucose-Capillary: 130 mg/dL — ABNORMAL HIGH (ref 65–99)

## 2017-04-02 LAB — COMPREHENSIVE METABOLIC PANEL
ALK PHOS: 121 U/L (ref 38–126)
ALT: 46 U/L (ref 17–63)
AST: 33 U/L (ref 15–41)
Albumin: 2.4 g/dL — ABNORMAL LOW (ref 3.5–5.0)
Anion gap: 5 (ref 5–15)
BUN: 11 mg/dL (ref 6–20)
CO2: 25 mmol/L (ref 22–32)
Calcium: 7.4 mg/dL — ABNORMAL LOW (ref 8.9–10.3)
Chloride: 105 mmol/L (ref 101–111)
Creatinine, Ser: 0.54 mg/dL — ABNORMAL LOW (ref 0.61–1.24)
Glucose, Bld: 157 mg/dL — ABNORMAL HIGH (ref 65–99)
Potassium: 3.4 mmol/L — ABNORMAL LOW (ref 3.5–5.1)
SODIUM: 135 mmol/L (ref 135–145)
TOTAL PROTEIN: 5.6 g/dL — AB (ref 6.5–8.1)
Total Bilirubin: 2.4 mg/dL — ABNORMAL HIGH (ref 0.3–1.2)

## 2017-04-02 LAB — CBC
HCT: 33 % — ABNORMAL LOW (ref 40.0–52.0)
HEMOGLOBIN: 11.4 g/dL — AB (ref 13.0–18.0)
MCH: 31.3 pg (ref 26.0–34.0)
MCHC: 34.4 g/dL (ref 32.0–36.0)
MCV: 91 fL (ref 80.0–100.0)
Platelets: 180 10*3/uL (ref 150–440)
RBC: 3.63 MIL/uL — AB (ref 4.40–5.90)
RDW: 13.1 % (ref 11.5–14.5)
WBC: 11.8 10*3/uL — ABNORMAL HIGH (ref 3.8–10.6)

## 2017-04-02 LAB — DIFFERENTIAL
BASOS PCT: 0 %
Basophils Absolute: 0 10*3/uL (ref 0–0.1)
EOS PCT: 1 %
Eosinophils Absolute: 0.1 10*3/uL (ref 0–0.7)
Lymphocytes Relative: 4 %
Lymphs Abs: 0.5 10*3/uL — ABNORMAL LOW (ref 1.0–3.6)
MONO ABS: 1.1 10*3/uL — AB (ref 0.2–1.0)
Monocytes Relative: 9 %
NEUTROS PCT: 86 %
Neutro Abs: 10.1 10*3/uL — ABNORMAL HIGH (ref 1.4–6.5)

## 2017-04-02 LAB — TRIGLYCERIDES: Triglycerides: 165 mg/dL — ABNORMAL HIGH (ref <150)

## 2017-04-02 LAB — PHOSPHORUS: PHOSPHORUS: 1.5 mg/dL — AB (ref 2.5–4.6)

## 2017-04-02 LAB — PREALBUMIN: PREALBUMIN: 6.6 mg/dL — AB (ref 18–38)

## 2017-04-02 LAB — MAGNESIUM: Magnesium: 1.9 mg/dL (ref 1.7–2.4)

## 2017-04-02 MED ORDER — ALBUTEROL SULFATE (2.5 MG/3ML) 0.083% IN NEBU
2.5000 mg | INHALATION_SOLUTION | RESPIRATORY_TRACT | Status: DC | PRN
  Administered 2017-04-03: 2.5 mg via RESPIRATORY_TRACT
  Filled 2017-04-02: qty 3

## 2017-04-02 MED ORDER — TRACE MINERALS CR-CU-MN-SE-ZN 10-1000-500-60 MCG/ML IV SOLN
INTRAVENOUS | Status: AC
  Administered 2017-04-02: 18:00:00 via INTRAVENOUS
  Filled 2017-04-02: qty 960

## 2017-04-02 MED ORDER — DEXTROSE 5 % IV SOLN
20.0000 mmol | Freq: Once | INTRAVENOUS | Status: AC
  Administered 2017-04-02: 20 mmol via INTRAVENOUS
  Filled 2017-04-02: qty 6.67

## 2017-04-02 NOTE — Progress Notes (Signed)
PHARMACY - ADULT TOTAL PARENTERAL NUTRITION CONSULT NOTE   Pharmacy Consult for TPN Indication: severe pancreatitis  Patient Measurements: Height: 5\' 7"  (170.2 cm) Weight: 178 lb 12.7 oz (81.1 kg)(bed scale) IBW/kg (Calculated) : 66.1 TPN AdjBW (KG): 71.7 Body mass index is 28 kg/m.  Assessment: Patient initiated on TPN on 2/16 per surgery for severe pancreatitis. RD following and making recommendations for TPN.  GI:  Endo:  Insulin requirements in the past 24 hours: 5 units Lytes: Mg 1.9 WNL, K = 3.4 & Phos = 1.5 low, corrected calcium 8.7 Renal: Pulm: Cards:  Hepatobil: Neuro: ID:  TPN Access: PICC placed 2/16 TPN start date: 2/16 Nutritional Goals  KCal: 1870 - 2180 Protein: 85 - 100 grams Fluid: 1.8 L/day  Current Nutrition: Clinimix E 5/15 at 40 mL/hr  Plan: Will wait until electrolytes normalize before increasing to goal rate per discussion with RD Continue Clinimix E 5/15 @ 40 mL/hr. No lipids at this time per RD Add MVI, trace elements to TPN  Ordered potassium phosphate 20 mmol IV once. Will recheck electrolytes with AM labs tomorrow. Continue q4h SSI TPN labs ordered per protocol  Cindi CarbonMary M Lunah Losasso, PharmD, BCPS Clinical Pharmacist 04/02/2017,11:01 AM

## 2017-04-02 NOTE — Progress Notes (Signed)
Initial Nutrition Assessment  DOCUMENTATION CODES:   Not applicable  INTERVENTION:  As phosphorus is low today at 1.5 mg/dL recommend continuing Clinimix E 5/15 at 40 mL/hr today.  Patient is receiving IV potassium phosphate and plan is to re-check phosphorus with AM labs. Once phosphorus is >/= 2 mg/dL can advance to goal regimen of Clinimix E 5/20 at 75 mL/hr + 20% ILE at 15 mL/hr over 12 hours. Provides 1944 kcal, 90 grams of protein, 1980 mL fluid daily.  Provide adult MVI and trace elements as TPN additives.  Noted on abdominal x-ray from this morning patient has a normal bowel gas pattern (previous distention has resolved). Patient has also been having bowel movements. Consider placement of nasogastric or nasojejunal tube for initiation of enteral nutrition as it is associated with better outcomes in pancreatitis cases compared with PN.  Patient appears to be volume-overloaded. He is approximately 8.8 kg above his usual body weight, has non-pitting edema present on exam, and is also reporting difficulty breathing/wheezing (hypophosphatemia may also be contributing to difficulty breathing).  Prior to discharge RD will provide education on diet to follow at home as patient and wife are requesting this. Will be more appropriate once his pain is improved and he is tolerating a diet.  NUTRITION DIAGNOSIS:   Inadequate oral intake related to inability to eat, acute illness(acute pancreatitis) as evidenced by per patient/family report.  GOAL:   Patient will meet greater than or equal to 90% of their needs  MONITOR:   PO intake, Diet advancement, Labs, Weight trends, Skin, I & O's  REASON FOR ASSESSMENT:   Consult New TPN/TNA  ASSESSMENT:   47 year old male with PMHx of asthma, hx inguinal hernia s/p repair who presented 2/12 with midepigastric abdominal pain worsened post-prandially and N/V found to have severe acute gallstone pancreatitis.   -Per abdominal x-ray 2/16 gaseous  distention of small bowel in upper left quadrant may be related to adynamic or inflammatory ileus. -Per abdominal x-ray 2/17 previous small bowel distention has resolved; bowel gas pattern is normal.  Met with patient and his wife at bedside. Their son was also present in room. Patient reports that on Monday evening after dinner he began experiencing abdominal pain and nausea. He took some Pepto-Bismol, but ignored the pain and went to bed. It worsened in the morning so he presented to emergency room. The meal he had Monday evening was [place] food followed by a glass of milk. His diet was briefly advanced to clear liquids, which he did not tolerate, so he was made NPO again. He reports that now that he thinks about it, he had actually been having milder abdominal pain and distention for about 1 week PTA so he had been eating smaller portions at meals that week. Usually has a good appetite and intake. Does not typically follow a special diet. Wife and patient are very interested in learning about diet to follow at home after discharge in setting of acute pancreatitis. Once his pancreatitis has improved they are also interested in following Mediterranean diet pattern. Patient very concerned about how volume-overloaded he is. He typically drinks 16 oz coffee in AM, 20 oz water at work, 8 oz Pepsi at lunch, 16 oz water at night (approximately 60 oz fluid; 1800 mL fluid). Patient reports plan will be for gallbladder removal in 6 weeks once pancreatitis has improved.  UBW 159 lbs (72.3 kg). Patient was 80.1 kg on 2/16 and RD obtained bed scale weight of 81.1 kg today.  Weight falsely elevated from volume overload. Will use 72.3 kg to estimate needs.  IV Access: right brachial double lumen PICC placed 04/01/2017; placement verified at bedside  TPN: on 2/16 patient was started on Clinimix E 5/15 at 40 mL/hr  Medications reviewed and include: Novolog 0-9 units Q4hrs (received 5 units past 24 hrs), pantoprazole,  simethicone, potassium phosphate 20 mmol once IV today, NS @ 75 mL/hr.  Labs reviewed: CBG 83-159, Potassium 3.4, Phosphorus 1.5, Magnesium 1.9 (WNL), Triglycerides 165 (within acceptable range).  Patient does not meet criteria for malnutrition at this time.  Discussed with Attending and Pharmacy. Plan is to discontinue NS.  NUTRITION - FOCUSED PHYSICAL EXAM:    Most Recent Value  Orbital Region  No depletion  Upper Arm Region  No depletion  Thoracic and Lumbar Region  No depletion  Buccal Region  No depletion  Temple Region  No depletion  Clavicle Bone Region  No depletion  Clavicle and Acromion Bone Region  No depletion  Scapular Bone Region  No depletion  Dorsal Hand  No depletion  Patellar Region  No depletion  Anterior Thigh Region  No depletion  Posterior Calf Region  No depletion  Edema (RD Assessment)  -- [generalized non-pitting edema]  Hair  Reviewed  Eyes  Reviewed  Mouth  Reviewed  Skin  Reviewed [pallor]  Nails  Reviewed     Diet Order:  Diet NPO time specified Except for: Ice Chips, Sips with Meds .TPN (CLINIMIX-E) Adult  EDUCATION NEEDS:   Not appropriate for education at this time  Skin:  Skin Assessment: Reviewed RN Assessment  Last BM:  04/01/2017 - medium type 1  Height:   Ht Readings from Last 1 Encounters:  03/28/17 '5\' 7"'  (1.702 m)    Weight:   Wt Readings from Last 1 Encounters:  04/02/17 178 lb 12.7 oz (81.1 kg)    Ideal Body Weight:  67.3 kg  BMI:  Body mass index is 28 kg/m.  Estimated Nutritional Needs:   Kcal:  1870-2180 (MSJ 1560 x 1.2-1.4)  Protein:  85-100 grams (1.2-1.4 grams/kg)  Fluid:  1.8 L/day (25 mL/kg)  Willey Blade, MS, RD, LDN Office: [phone] Pager: [phone] After Hours/Weekend Pager: [phone]

## 2017-04-02 NOTE — Progress Notes (Signed)
[employer] [facility] - [place] at [facility]   PATIENT NAME: Warren David    MR#:  [id number]  DATE OF BIRTH:  08-08-1970  SUBJECTIVE:   Still requiring pain medications for abdominal pain. Abdominal distention is resolving. Had several bowel movements throughout the day and yesterday.  REVIEW OF SYSTEMS:    Review of Systems  Constitutional: Negative for fever, chills weight loss HENT: Negative for ear pain, nosebleeds, congestion, facial swelling, rhinorrhea, neck pain, neck stiffness and ear discharge.   Respiratory: Negative for cough, shortness of breath, wheezing  Cardiovascular: Negative for chest pain, palpitations and leg swelling.  Gastrointestinal: Negative for heartburn, positive abdominal pain, no vomiting, diarrhea or consitpation Genitourinary: Negative for dysuria, urgency, frequency, hematuria Musculoskeletal: Negative for back pain or joint pain Neurological: Negative for dizziness, seizures, syncope, focal weakness,  numbness and headaches.  Hematological: Does not bruise/bleed easily.  Psychiatric/Behavioral: Negative for hallucinations, confusion, dysphoric mood    Tolerating Diet: npo      DRUG ALLERGIES:   Allergies  Allergen Reactions  . Fluticasone-Salmeterol     REACTION: palpatations    VITALS:  Blood pressure (!) 141/83, pulse 88, temperature 97.9 F (36.6 C), temperature source Oral, resp. rate 16, height 5\' 7"  (1.702 m), weight 81.1 kg (178 lb 12.7 oz), SpO2 92 %.  PHYSICAL EXAMINATION:  Constitutional: Appears well-developed and well-nourished. No distress. HENT: Normocephalic. Marland Kitchen Oropharynx is clear and moist.  Eyes: Conjunctivae and EOM are normal. PERRLA, no scleral icterus.  Neck: Normal ROM. Neck supple. No JVD. No tracheal deviation. CVS: RRR, S1/S2 +, no murmurs, no gallops, no carotid bruit.  Pulmonary: Effort and breath sounds normal, no stridor, rhonchi, wheezes, rales.  Abdominal: Generalized tenderness without  distention, rebound or guarding  Musculoskeletal: Normal range of motion. No edema and no tenderness.  Neuro: Alert. CN 2-12 grossly intact. No focal deficits. Skin: Skin is warm and dry. No rash noted. Psychiatric: Normal mood and affect.      LABORATORY PANEL:   CBC Recent Labs  Lab 04/02/17 0438  WBC 11.8*  HGB 11.4*  HCT 33.0*  PLT 180   ------------------------------------------------------------------------------------------------------------------  Chemistries  Recent Labs  Lab 04/02/17 0438  NA 135  K 3.4*  CL 105  CO2 25  GLUCOSE 157*  BUN 11  CREATININE 0.54*  CALCIUM 7.4*  MG 1.9  AST 33  ALT 46  ALKPHOS 121  BILITOT 2.4*   ------------------------------------------------------------------------------------------------------------------  Cardiac Enzymes No results for input(s): TROPONINI in the last 168 hours. ------------------------------------------------------------------------------------------------------------------  RADIOLOGY:  Dg Abd 1 View  Result Date: 04/02/2017 CLINICAL DATA:  Pancreatitis. EXAM: ABDOMEN - 1 VIEW COMPARISON:  Abdominal x-ray from yesterday. FINDINGS: The bowel gas pattern is normal. The previously seen small bowel distention in the left upper quadrant has resolved. Contrast is seen within the colon. No radio-opaque calculi or other significant radiographic abnormality are seen. IMPRESSION: Negative. Electronically Signed   By: Obie Dredge M.D.   On: 04/02/2017 09:21   Dg Abd 1 View  Result Date: 04/01/2017 CLINICAL DATA:  Gallstone pancreatitis with persistent abdominal pain EXAM: ABDOMEN - 1 VIEW COMPARISON:  CT scan 03/31/2017 FINDINGS: Gaseous distention of small bowel in the left upper quadrant noted, measuring up to 3.8 cm diameter. No overt obstructive pattern at this time. Gas and contrast material noted in a nondilated right colon. No unexpected abdominopelvic calcification. Visualized bony anatomy unremarkable.  IMPRESSION: Gaseous distention of small bowel in the left upper quadrant may be related to adynamic or inflammatory ileus. No  overt obstructive pattern at this time but serial x-rays may be warranted to exclude progression. Electronically Signed   By: Kennith CenterEric  Mansell M.D.   On: 04/01/2017 11:10   Ct Abdomen Pelvis W Contrast  Result Date: 03/31/2017 CLINICAL DATA:  Acute pancreatitis EXAM: CT ABDOMEN AND PELVIS WITH CONTRAST TECHNIQUE: Multidetector CT imaging of the abdomen and pelvis was performed using the standard protocol following bolus administration of intravenous contrast. Oral contrast was also administered. CONTRAST:  100mL ISOVUE-300 IOPAMIDOL (ISOVUE-300) INJECTION 61% COMPARISON:  CT abdomen and pelvis March 28, 2017; abdominal MRI March 28, 2017 FINDINGS: Lower chest: There are bilateral pleural effusions. There is airspace consolidation in both posterior lung base regions. Lungs bases elsewhere are clear. Hepatobiliary: There is hepatic steatosis. No focal liver lesions are evident. Gallbladder wall is not appreciably thickened. There is a gallstone within the gallbladder. Fluid tracks from the peripancreatic region to the gallbladder fossa. There is no biliary duct dilatation. Pancreas: The pancreas appears somewhat edematous, stable from recent studies. There is uniform enhancement of the pancreas without evidence of pancreatic necrosis. There is no appreciable pancreatic duct dilatation. There is no well-defined mass or pseudocyst. There is fluid tracking along the contour of the pancreas somewhat diffusely. There is moderate fluid extending between the pancreas and loops of small bowel in the mid abdominal region. There is fluid tracking along the greater curvature of the stomach, similar to recent study. On the left, fluid tracks laterally to the level of the anterior conal fascia, stable. On the right, fluid tracks anteriorly to the level of the gallbladder fossa. Fluid tracks along the  course of the second portion of the duodenum, stable. There is fluid abutting the third portion of the duodenum, stable. Fluid tracks more inferiorly into the lower abdomen and pelvis on both sides with mesenteric thickening throughout the mid and lower abdominal regions and into the pelvis with the degree of fluid extending into the pelvis new from prior study. Moderate fluid collects between the urinary bladder and rectum, a finding not present 2 days prior. Spleen: No splenic lesions evident. Adrenals/Urinary Tract: Adrenals bilaterally appear normal. Kidneys bilaterally show no evident mass or hydronephrosis on either side. There is a 5 x 4 mm calculus in the mid right kidney. No ureteral calculi are evident on either side. Urinary bladder is midline with wall thickness within normal limits. Stomach/Bowel: Fluid from pancreatitis abuts loops of mid jejunum in the left abdomen with equivocal wall thickening in several areas of jejunum, likely due to inflammation from the pancreatitis. Slight wall thickening in the second portion the duodenum is felt to be due to adjacent pancreatitis. Thickening along the wall of the greater curvature is likewise felt to be due to the fluid from pancreatitis. No bowel obstruction. No evident free air or portal venous air. Vascular/Lymphatic: There is no abdominal aortic aneurysm. There is atherosclerotic calcification in the aorta and right common iliac artery. Major mesenteric vessels appear patent. There is no appreciable adenopathy in the abdomen or pelvis. Reproductive: Prostate and seminal vesicles appear normal in size and contour. No evident pelvic mass. There is a surgical clip in each superior scrotal region. Other: No peripancreatic lesion is evident. No abscess is evident in the abdomen or pelvis. Musculoskeletal: There are no blastic or lytic bone lesions. There is spina bifida occulta at S1. No intramuscular or abdominal wall lesions evident. IMPRESSION: 1. Acute  pancreatitis. There is increase in peripancreatic fluid tracking into the lower abdomen and pelvis compared to 2  days prior. The amount of fluid surrounding the pancreas itself is stable. There is no well-defined pancreatic mass or abscess. No pancreatic duct dilatation. Pancreas is mildly edematous. There is no evident pancreatic necrosis. 2. There are areas of gastritis and enteritis due to inflamed peripancreatic fluid abutting these areas. No bowel obstruction. No abscess. No periappendiceal region evident. 3.  Cholelithiasis.  No gallbladder wall thickening. 4. 5 x 4 mm nonobstructing calculus mid right kidney. No hydronephrosis or ureteral calculus on either side. 5.  Pleural effusions with airspace consolidation both lung bases. 6.  Aortoiliac atherosclerosis. Aortic Atherosclerosis (ICD10-I70.0). Electronically Signed   By: Bretta Bang III M.D.   On: 03/31/2017 14:39     ASSESSMENT AND PLAN:   47 year old male with mild intermittent asthma who presented with abdominal pain.   1. Acute pancreatitis with pancreatitis ascites  Continue nothing by mouthS patient continues to have abdominal pain  Continue supportive management with when necessary antiemetics and pain medications PICC line for nutrition MRCP without evidence of acute cholecystitis or choledocholithiasis No surgical intervention Appreciate surgery evaluation Ileus has resolved  2. Mild intermittent asthma without signs of exacerbation  3. Hypokalemia: Replete and recheck in a.m.    Management plans discussed with the patient and  wife and he is in agreement.  CODE STATUS: Full  TOTAL TIME TAKING CARE OF THIS PATIENT: 23 minutes.     POSSIBLE D/C 3-5 days, DEPENDING ON CLINICAL CONDITION.   Raelea Gosse M.D on 04/02/2017 at 11:27 AM  Between 7am to 6pm - Pager - [phone] After 6pm go to [email or web address] - password [email or web address]  [facility]  Office  [phone]  CC: Primary care  physician; Karie Schwalbe, MD  Note: This dictation was prepared with Dragon dictation along with smaller phrase technology. Any transcriptional errors that result from this process are unintentional.

## 2017-04-02 NOTE — Progress Notes (Signed)
Called Dr. Katheren ShamsSalary regarding patient complaints of wheezing.  Doctor recommended prn breathing treatments.  Orders were placed.  Warren David, Warren David  04/02/2017  7:01 AM

## 2017-04-02 NOTE — Progress Notes (Signed)
CC: Pancreatitis Subjective: Patient reports that his abdominal distention and discomfort is unchanged today.  He had a PICC line placed yesterday and has been started on TPN.  Continues to state he feels thirsty frustrated with how long it is taking for him to get better.  Objective: Vital signs in last 24 hours: Temp:  [97.8 F (36.6 C)-99.5 F (37.5 C)] 97.9 F (36.6 C) (02/17 0429) Pulse Rate:  [88-93] 88 (02/17 0429) Resp:  [16-18] 16 (02/17 0429) BP: (140-148)/(83-85) 141/83 (02/17 0429) SpO2:  [92 %-94 %] 92 % (02/17 0429) Weight:  [80.1 kg (176 lb 9.6 oz)] 80.1 kg (176 lb 9.6 oz) (02/16 1357) Last BM Date: 04/02/17  Intake/Output from previous day: 02/16 0701 - 02/17 0700 In: 3980 [I.V.:3730; IV Piggyback:250] Out: 1550 [Urine:1550] Intake/Output this shift: Total I/O In: 1580 [I.V.:1580] Out: 75 [Urine:75]  Physical exam:  General: No acute distress Chest: Clear to auscultation Heart: Regular rate and rhythm Abdomen: Soft, tender to palpation in all quadrants but worse in the mid epigastrium, moderately distended.  Lab Results: CBC  Recent Labs    04/02/17 0438  WBC 11.8*  HGB 11.4*  HCT 33.0*  PLT 180   BMET Recent Labs    04/01/17 0457 04/02/17 0438  NA 136 135  K 3.2* 3.4*  CL 104 105  CO2 21* 25  GLUCOSE 98 157*  BUN 11 11  CREATININE 0.65 0.54*  CALCIUM 7.6* 7.4*   PT/INR No results for input(s): LABPROT, INR in the last 72 hours. ABG No results for input(s): PHART, HCO3 in the last 72 hours.  Invalid input(s): PCO2, PO2  Studies/Results: Dg Abd 1 View  Result Date: 04/02/2017 CLINICAL DATA:  Pancreatitis. EXAM: ABDOMEN - 1 VIEW COMPARISON:  Abdominal x-ray from yesterday. FINDINGS: The bowel gas pattern is normal. The previously seen small bowel distention in the left upper quadrant has resolved. Contrast is seen within the colon. No radio-opaque calculi or other significant radiographic abnormality are seen. IMPRESSION: Negative.  Electronically Signed   By: Obie DredgeWilliam T Derry M.D.   On: 04/02/2017 09:21   Dg Abd 1 View  Result Date: 04/01/2017 CLINICAL DATA:  Gallstone pancreatitis with persistent abdominal pain EXAM: ABDOMEN - 1 VIEW COMPARISON:  CT scan 03/31/2017 FINDINGS: Gaseous distention of small bowel in the left upper quadrant noted, measuring up to 3.8 cm diameter. No overt obstructive pattern at this time. Gas and contrast material noted in a nondilated right colon. No unexpected abdominopelvic calcification. Visualized bony anatomy unremarkable. IMPRESSION: Gaseous distention of small bowel in the left upper quadrant may be related to adynamic or inflammatory ileus. No overt obstructive pattern at this time but serial x-rays may be warranted to exclude progression. Electronically Signed   By: Kennith CenterEric  Mansell M.D.   On: 04/01/2017 11:10   Ct Abdomen Pelvis W Contrast  Result Date: 03/31/2017 CLINICAL DATA:  Acute pancreatitis EXAM: CT ABDOMEN AND PELVIS WITH CONTRAST TECHNIQUE: Multidetector CT imaging of the abdomen and pelvis was performed using the standard protocol following bolus administration of intravenous contrast. Oral contrast was also administered. CONTRAST:  100mL ISOVUE-300 IOPAMIDOL (ISOVUE-300) INJECTION 61% COMPARISON:  CT abdomen and pelvis March 28, 2017; abdominal MRI March 28, 2017 FINDINGS: Lower chest: There are bilateral pleural effusions. There is airspace consolidation in both posterior lung base regions. Lungs bases elsewhere are clear. Hepatobiliary: There is hepatic steatosis. No focal liver lesions are evident. Gallbladder wall is not appreciably thickened. There is a gallstone within the gallbladder. Fluid tracks from the peripancreatic  region to the gallbladder fossa. There is no biliary duct dilatation. Pancreas: The pancreas appears somewhat edematous, stable from recent studies. There is uniform enhancement of the pancreas without evidence of pancreatic necrosis. There is no appreciable  pancreatic duct dilatation. There is no well-defined mass or pseudocyst. There is fluid tracking along the contour of the pancreas somewhat diffusely. There is moderate fluid extending between the pancreas and loops of small bowel in the mid abdominal region. There is fluid tracking along the greater curvature of the stomach, similar to recent study. On the left, fluid tracks laterally to the level of the anterior conal fascia, stable. On the right, fluid tracks anteriorly to the level of the gallbladder fossa. Fluid tracks along the course of the second portion of the duodenum, stable. There is fluid abutting the third portion of the duodenum, stable. Fluid tracks more inferiorly into the lower abdomen and pelvis on both sides with mesenteric thickening throughout the mid and lower abdominal regions and into the pelvis with the degree of fluid extending into the pelvis new from prior study. Moderate fluid collects between the urinary bladder and rectum, a finding not present 2 days prior. Spleen: No splenic lesions evident. Adrenals/Urinary Tract: Adrenals bilaterally appear normal. Kidneys bilaterally show no evident mass or hydronephrosis on either side. There is a 5 x 4 mm calculus in the mid right kidney. No ureteral calculi are evident on either side. Urinary bladder is midline with wall thickness within normal limits. Stomach/Bowel: Fluid from pancreatitis abuts loops of mid jejunum in the left abdomen with equivocal wall thickening in several areas of jejunum, likely due to inflammation from the pancreatitis. Slight wall thickening in the second portion the duodenum is felt to be due to adjacent pancreatitis. Thickening along the wall of the greater curvature is likewise felt to be due to the fluid from pancreatitis. No bowel obstruction. No evident free air or portal venous air. Vascular/Lymphatic: There is no abdominal aortic aneurysm. There is atherosclerotic calcification in the aorta and right common  iliac artery. Major mesenteric vessels appear patent. There is no appreciable adenopathy in the abdomen or pelvis. Reproductive: Prostate and seminal vesicles appear normal in size and contour. No evident pelvic mass. There is a surgical clip in each superior scrotal region. Other: No peripancreatic lesion is evident. No abscess is evident in the abdomen or pelvis. Musculoskeletal: There are no blastic or lytic bone lesions. There is spina bifida occulta at S1. No intramuscular or abdominal wall lesions evident. IMPRESSION: 1. Acute pancreatitis. There is increase in peripancreatic fluid tracking into the lower abdomen and pelvis compared to 2 days prior. The amount of fluid surrounding the pancreas itself is stable. There is no well-defined pancreatic mass or abscess. No pancreatic duct dilatation. Pancreas is mildly edematous. There is no evident pancreatic necrosis. 2. There are areas of gastritis and enteritis due to inflamed peripancreatic fluid abutting these areas. No bowel obstruction. No abscess. No periappendiceal region evident. 3.  Cholelithiasis.  No gallbladder wall thickening. 4. 5 x 4 mm nonobstructing calculus mid right kidney. No hydronephrosis or ureteral calculus on either side. 5.  Pleural effusions with airspace consolidation both lung bases. 6.  Aortoiliac atherosclerosis. Aortic Atherosclerosis (ICD10-I70.0). Electronically Signed   By: Bretta Bang III M.D.   On: 03/31/2017 14:39    Anti-infectives: Anti-infectives (From admission, onward)   Start     Dose/Rate Route Frequency Ordered Stop   03/28/17 1800  piperacillin-tazobactam (ZOSYN) IVPB 3.375 g  Status:  Discontinued  3.375 g 12.5 mL/hr over 240 Minutes Intravenous Every 8 hours 03/28/17 1159 03/31/17 1345   03/28/17 1100  piperacillin-tazobactam (ZOSYN) IVPB 3.375 g     3.375 g 100 mL/hr over 30 Minutes Intravenous  Once 03/28/17 1055 03/28/17 1208      Assessment/Plan:  47 year old male with pancreatitis and  associated pancreatitis ascites.  Now on TPN.  Continue to await resolution of symptoms.  No plans for urgent surgical intervention in this complex abdominal pathology.  General surgery will continue to follow with you.  Sally Menard T. Tonita Cong, MD, [facility] General Surgeon Operating Room Services  Day [facility] [phone] Night [facility] [phone] 04/02/2017

## 2017-04-03 DIAGNOSIS — K8511 Biliary acute pancreatitis with uninfected necrosis: Secondary | ICD-10-CM | POA: Diagnosis not present

## 2017-04-03 LAB — COMPREHENSIVE METABOLIC PANEL
ALT: 51 U/L (ref 17–63)
AST: 39 U/L (ref 15–41)
Albumin: 2.2 g/dL — ABNORMAL LOW (ref 3.5–5.0)
Alkaline Phosphatase: 163 U/L — ABNORMAL HIGH (ref 38–126)
Anion gap: 7 (ref 5–15)
BUN: 9 mg/dL (ref 6–20)
CHLORIDE: 102 mmol/L (ref 101–111)
CO2: 26 mmol/L (ref 22–32)
Calcium: 7.5 mg/dL — ABNORMAL LOW (ref 8.9–10.3)
Creatinine, Ser: 0.69 mg/dL (ref 0.61–1.24)
Glucose, Bld: 133 mg/dL — ABNORMAL HIGH (ref 65–99)
POTASSIUM: 3.1 mmol/L — AB (ref 3.5–5.1)
Sodium: 135 mmol/L (ref 135–145)
TOTAL PROTEIN: 5.8 g/dL — AB (ref 6.5–8.1)
Total Bilirubin: 2.2 mg/dL — ABNORMAL HIGH (ref 0.3–1.2)

## 2017-04-03 LAB — GLUCOSE, CAPILLARY
GLUCOSE-CAPILLARY: 152 mg/dL — AB (ref 65–99)
Glucose-Capillary: 142 mg/dL — ABNORMAL HIGH (ref 65–99)
Glucose-Capillary: 130 mg/dL — ABNORMAL HIGH (ref 65–99)
Glucose-Capillary: 146 mg/dL — ABNORMAL HIGH (ref 65–99)
Glucose-Capillary: 138 mg/dL — ABNORMAL HIGH (ref 65–99)

## 2017-04-03 LAB — MAGNESIUM: MAGNESIUM: 1.9 mg/dL (ref 1.7–2.4)

## 2017-04-03 LAB — CBC
HCT: 33.9 % — ABNORMAL LOW (ref 40.0–52.0)
HEMOGLOBIN: 11.3 g/dL — AB (ref 13.0–18.0)
MCH: 30.3 pg (ref 26.0–34.0)
MCHC: 33.4 g/dL (ref 32.0–36.0)
MCV: 90.9 fL (ref 80.0–100.0)
PLATELETS: 234 10*3/uL (ref 150–440)
RBC: 3.73 MIL/uL — AB (ref 4.40–5.90)
RDW: 12.9 % (ref 11.5–14.5)
WBC: 14 10*3/uL — ABNORMAL HIGH (ref 3.8–10.6)

## 2017-04-03 LAB — DIFFERENTIAL
BASOS ABS: 0 10*3/uL (ref 0–0.1)
Basophils Relative: 0 %
EOS ABS: 0.2 10*3/uL (ref 0–0.7)
Eosinophils Relative: 1 %
LYMPHS ABS: 0.7 10*3/uL — AB (ref 1.0–3.6)
Lymphocytes Relative: 5 %
Monocytes Absolute: 1.3 10*3/uL — ABNORMAL HIGH (ref 0.2–1.0)
Monocytes Relative: 9 %
Neutro Abs: 11.8 10*3/uL — ABNORMAL HIGH (ref 1.4–6.5)
Neutrophils Relative %: 85 %

## 2017-04-03 LAB — POTASSIUM: Potassium: 3.3 mmol/L — ABNORMAL LOW (ref 3.5–5.1)

## 2017-04-03 LAB — TRIGLYCERIDES: TRIGLYCERIDES: 207 mg/dL — AB (ref <150)

## 2017-04-03 LAB — PHOSPHORUS
Phosphorus: 2.2 mg/dL — ABNORMAL LOW (ref 2.5–4.6)
Phosphorus: 3 mg/dL (ref 2.5–4.6)

## 2017-04-03 LAB — PREALBUMIN: Prealbumin: 7.3 mg/dL — ABNORMAL LOW (ref 18–38)

## 2017-04-03 MED ORDER — TRACE MINERALS CR-CU-MN-SE-ZN 10-1000-500-60 MCG/ML IV SOLN
INTRAVENOUS | Status: AC
  Administered 2017-04-03: 18:00:00 via INTRAVENOUS
  Filled 2017-04-03: qty 1800

## 2017-04-03 MED ORDER — POTASSIUM CHLORIDE 10 MEQ/100ML IV SOLN
10.0000 meq | INTRAVENOUS | Status: AC
  Administered 2017-04-03 (×3): 10 meq via INTRAVENOUS
  Filled 2017-04-03 (×3): qty 100

## 2017-04-03 MED ORDER — POTASSIUM CHLORIDE 10 MEQ/100ML IV SOLN
10.0000 meq | INTRAVENOUS | Status: AC
Start: 2017-04-03 — End: 2017-04-04
  Administered 2017-04-03 (×4): 10 meq via INTRAVENOUS
  Filled 2017-04-03 (×4): qty 100

## 2017-04-03 MED ORDER — POTASSIUM PHOSPHATES 15 MMOLE/5ML IV SOLN
20.0000 mmol | Freq: Once | INTRAVENOUS | Status: AC
  Administered 2017-04-03: 20 mmol via INTRAVENOUS
  Filled 2017-04-03: qty 6.67

## 2017-04-03 MED ORDER — FAT EMULSION 20 % IV EMUL
250.0000 mL | INTRAVENOUS | Status: AC
  Administered 2017-04-03: 250 mL via INTRAVENOUS
  Filled 2017-04-03: qty 250

## 2017-04-03 MED ORDER — TRAMADOL HCL 50 MG PO TABS
50.0000 mg | ORAL_TABLET | Freq: Four times a day (QID) | ORAL | Status: DC | PRN
  Administered 2017-04-03 – 2017-04-04 (×4): 50 mg via ORAL
  Filled 2017-04-03 (×4): qty 1

## 2017-04-03 NOTE — Progress Notes (Signed)
PHARMACY - ADULT TOTAL PARENTERAL NUTRITION CONSULT NOTE   Pharmacy Consult for TPN Indication: severe pancreatitis  Patient Measurements: Height: 5\' 7"  (170.2 cm) Weight: 164 lb 9.6 oz (74.7 kg) IBW/kg (Calculated) : 66.1 TPN AdjBW (KG): 71.7 Body mass index is 25.78 kg/m.  Assessment: Patient initiated on TPN on 2/16 per surgery for severe pancreatitis. RD following and making recommendations for TPN.  GI:  Endo:  Insulin requirements in the past 24 hours: 8 units Lytes:  Sodium (mmol/L)  Date Value  04/03/2017 135   Potassium (mmol/L)  Date Value  04/03/2017 3.3 (L)   Magnesium (mg/dL)  Date Value  09/81/191402/18/2019 1.9   Phosphorus (mg/dL)  Date Value  78/29/562102/18/2019 3.0   Calcium (mg/dL)  Date Value  30/86/578402/18/2019 7.5 (L)   Albumin (g/dL)  Date Value  69/62/952802/18/2019 2.2 (L)    Renal: Pulm: Cards:  Hepatobil: Neuro: ID:  TPN Access: PICC placed 2/16 TPN start date: 2/16 Nutritional Goals  KCal: 1870 - 2180 Protein: 85 - 100 grams Fluid: 1.8 L/day  Current Nutrition: Clinimix E 5/15 at 40 mL/hr  Plan: Will advance TPN with Clinimix E 5/20 to goal rate of 75 ml/hr and initiate 20% ILE at 15 ml/hr.  Add MVI, trace elements to TPN  Kphos 20 mmol iv once and KCl 10 meq IV x 3. Will recheck K and phos at 1800 and all electrolytes with AM labs tomorrow. Continue q4h SSI TPN labs ordered per protocol  2/18 1859 K 3.3, phos 3. Give potassium chloride 10 mEq IV Q1H x 4 doses. Will recheck electrolytes tomorrow with AM labs.   Carola FrostNathan A Thanh Pomerleau, PharmD, BCPS Clinical Pharmacist 04/03/2017,7:48 PM

## 2017-04-03 NOTE — Progress Notes (Signed)
CC: abd pain Subjective: This is a pt with bil panc. No worsenng today, not much improvement No N/V  Objective: Vital signs in last 24 hours: Temp:  [97.9 F (36.6 C)-98.4 F (36.9 C)] 98.1 F (36.7 C) (02/18 0338) Pulse Rate:  [94-101] 98 (02/18 0338) Resp:  [18] 18 (02/18 0338) BP: (142-155)/(89-93) 142/90 (02/18 0338) SpO2:  [90 %-93 %] 91 % (02/18 0338) Weight:  [167 lb 8.8 oz (76 kg)] 167 lb 8.8 oz (76 kg) (02/18 1000) Last BM Date: 04/02/17  Intake/Output from previous day: 02/17 0701 - 02/18 0700 In: 2971 [I.V.:2471; IV Piggyback:500] Out: 1675 [Urine:1675] Intake/Output this shift: Total I/O In: 422 [I.V.:208; IV Piggyback:214] Out: 875 [Urine:875]  Physical exam: VSS afeb Distended, tympanitic, min tenderness No ict. No jaundice  Lab Results: CBC  Recent Labs    04/02/17 0438 04/03/17 0340  WBC 11.8* 14.0*  HGB 11.4* 11.3*  HCT 33.0* 33.9*  PLT 180 234   BMET Recent Labs    04/02/17 0438 04/03/17 0340  NA 135 135  K 3.4* 3.1*  CL 105 102  CO2 25 26  GLUCOSE 157* 133*  BUN 11 9  CREATININE 0.54* 0.69  CALCIUM 7.4* 7.5*   PT/INR No results for input(s): LABPROT, INR in the last 72 hours. ABG No results for input(s): PHART, HCO3 in the last 72 hours.  Invalid input(s): PCO2, PO2  Studies/Results: Dg Abd 1 View  Result Date: 04/02/2017 CLINICAL DATA:  Pancreatitis. EXAM: ABDOMEN - 1 VIEW COMPARISON:  Abdominal x-ray from yesterday. FINDINGS: The bowel gas pattern is normal. The previously seen small bowel distention in the left upper quadrant has resolved. Contrast is seen within the colon. No radio-opaque calculi or other significant radiographic abnormality are seen. IMPRESSION: Negative. Electronically Signed   By: Obie DredgeWilliam T Derry M.D.   On: 04/02/2017 09:21    Anti-infectives: Anti-infectives (From admission, onward)   Start     Dose/Rate Route Frequency Ordered Stop   03/28/17 1800  piperacillin-tazobactam (ZOSYN) IVPB 3.375 g   Status:  Discontinued     3.375 g 12.5 mL/hr over 240 Minutes Intravenous Every 8 hours 03/28/17 1159 03/31/17 1345   03/28/17 1100  piperacillin-tazobactam (ZOSYN) IVPB 3.375 g     3.375 g 100 mL/hr over 30 Minutes Intravenous  Once 03/28/17 1055 03/28/17 1208      Assessment/Plan:  Discussed TPN with Nut Consult No role for surgery at this time, following  Lattie Hawichard E Temesgen Weightman, MD, FACS  04/03/2017

## 2017-04-03 NOTE — Progress Notes (Signed)
[facility] - [place] at [facility]   PATIENT NAME: Warren David    MR#:  [id number]  DATE OF BIRTH:  09-07-1970  SUBJECTIVE:   Abdominal pain slowly improving  REVIEW OF SYSTEMS:    Review of Systems  Constitutional: Negative for fever, chills weight loss HENT: Negative for ear pain, nosebleeds, congestion, facial swelling, rhinorrhea, neck pain, neck stiffness and ear discharge.   Respiratory: Negative for cough, shortness of breath, wheezing  Cardiovascular: Negative for chest pain, palpitations and leg swelling.  Gastrointestinal: Negative for heartburn, positive abdominal pain, no vomiting, diarrhea or consitpation Genitourinary: Negative for dysuria, urgency, frequency, hematuria Musculoskeletal: Negative for back pain or joint pain Neurological: Negative for dizziness, seizures, syncope, focal weakness,  numbness and headaches.  Hematological: Does not bruise/bleed easily.  Psychiatric/Behavioral: Negative for hallucinations, confusion, dysphoric mood    Tolerating Diet: npo      DRUG ALLERGIES:   Allergies  Allergen Reactions  . Fluticasone-Salmeterol     REACTION: palpatations    VITALS:  Blood pressure (!) 142/90, pulse 98, temperature 98.1 F (36.7 C), temperature source Oral, resp. rate 18, height 5\' 7"  (1.702 m), weight 76 kg (167 lb 8.8 oz), SpO2 91 %.  PHYSICAL EXAMINATION:  Constitutional: Appears well-developed and well-nourished. No distress. HENT: Normocephalic. Marland Kitchen Oropharynx is clear and moist.  Eyes: Conjunctivae and EOM are normal. PERRLA, no scleral icterus.  Neck: Normal ROM. Neck supple. No JVD. No tracheal deviation. CVS: RRR, S1/S2 +, no murmurs, no gallops, no carotid bruit.  Pulmonary: Effort and breath sounds normal, no stridor, rhonchi, wheezes, rales.  Abdominal: Generalized tenderness without distention, rebound or guarding  Musculoskeletal: Normal range of motion. No edema and no tenderness.  Neuro: Alert. CN 2-12  grossly intact. No focal deficits. Skin: Skin is warm and dry. No rash noted. Psychiatric: Normal mood and affect.      LABORATORY PANEL:   CBC Recent Labs  Lab 04/03/17 0340  WBC 14.0*  HGB 11.3*  HCT 33.9*  PLT 234   ------------------------------------------------------------------------------------------------------------------  Chemistries  Recent Labs  Lab 04/03/17 0340  NA 135  K 3.1*  CL 102  CO2 26  GLUCOSE 133*  BUN 9  CREATININE 0.69  CALCIUM 7.5*  MG 1.9  AST 39  ALT 51  ALKPHOS 163*  BILITOT 2.2*   ------------------------------------------------------------------------------------------------------------------  Cardiac Enzymes No results for input(s): TROPONINI in the last 168 hours. ------------------------------------------------------------------------------------------------------------------  RADIOLOGY:  Dg Abd 1 View  Result Date: 04/02/2017 CLINICAL DATA:  Pancreatitis. EXAM: ABDOMEN - 1 VIEW COMPARISON:  Abdominal x-ray from yesterday. FINDINGS: The bowel gas pattern is normal. The previously seen small bowel distention in the left upper quadrant has resolved. Contrast is seen within the colon. No radio-opaque calculi or other significant radiographic abnormality are seen. IMPRESSION: Negative. Electronically Signed   By: Obie Dredge M.D.   On: 04/02/2017 09:21   Dg Abd 1 View  Result Date: 04/01/2017 CLINICAL DATA:  Gallstone pancreatitis with persistent abdominal pain EXAM: ABDOMEN - 1 VIEW COMPARISON:  CT scan 03/31/2017 FINDINGS: Gaseous distention of small bowel in the left upper quadrant noted, measuring up to 3.8 cm diameter. No overt obstructive pattern at this time. Gas and contrast material noted in a nondilated right colon. No unexpected abdominopelvic calcification. Visualized bony anatomy unremarkable. IMPRESSION: Gaseous distention of small bowel in the left upper quadrant may be related to adynamic or inflammatory ileus. No  overt obstructive pattern at this time but serial x-rays may be warranted to exclude progression. Electronically  Signed   By: Kennith CenterEric  Mansell M.D.   On: 04/01/2017 11:10     ASSESSMENT AND PLAN:   47 year old male with mild intermittent asthma who presented with abdominal pain.   1. Acute pancreatitis with pancreatitis ascites  Continue nothing by mouth patient continues to have abdominal pain although improved Continue supportive management with when necessary antiemetics and pain medications PICC line for nutrition MRCP without evidence of acute cholecystitis or choledocholithiasis No surgical intervention Appreciate surgery evaluation Ileus has resolved  2. Mild intermittent asthma without signs of exacerbation  3. Hypokalemia: Replete and recheck in a.m.    Management plans discussed with the patient and  wife and he is in agreement.  CODE STATUS: Full  TOTAL TIME TAKING CARE OF THIS PATIENT: 23 minutes.     POSSIBLE D/C 3-5 days, DEPENDING ON CLINICAL CONDITION.   Tyanne Derocher M.D on 04/03/2017 at 10:11 AM  Between 7am to 6pm - Pager - [phone] After 6pm go to [email or web address] - password [email or web address]  [facility]  Office  [phone]  CC: Primary care physician; Karie SchwalbeLetvak, Richard I, MD  Note: This dictation was prepared with Dragon dictation along with smaller phrase technology. Any transcriptional errors that result from this process are unintentional.

## 2017-04-03 NOTE — Progress Notes (Addendum)
PHARMACY - ADULT TOTAL PARENTERAL NUTRITION CONSULT NOTE   Pharmacy Consult for TPN Indication: severe pancreatitis  Patient Measurements: Height: 5\' 7"  (170.2 cm) Weight: 167 lb 8.8 oz (76 kg)(bed scale) IBW/kg (Calculated) : 66.1 TPN AdjBW (KG): 71.7 Body mass index is 26.24 kg/m.  Assessment: Patient initiated on TPN on 2/16 per surgery for severe pancreatitis. RD following and making recommendations for TPN.  GI:  Endo:  Insulin requirements in the past 24 hours: 8 units Lytes:  Sodium (mmol/L)  Date Value  04/03/2017 135   Potassium (mmol/L)  Date Value  04/03/2017 3.1 (L)   Magnesium (mg/dL)  Date Value  56/43/329502/18/2019 1.9   Phosphorus (mg/dL)  Date Value  18/84/166002/18/2019 2.2 (L)   Calcium (mg/dL)  Date Value  63/01/601002/18/2019 7.5 (L)   Albumin (g/dL)  Date Value  93/23/557302/18/2019 2.2 (L)    Renal: Pulm: Cards:  Hepatobil: Neuro: ID:  TPN Access: PICC placed 2/16 TPN start date: 2/16 Nutritional Goals  KCal: 1870 - 2180 Protein: 85 - 100 grams Fluid: 1.8 L/day  Current Nutrition: Clinimix E 5/15 at 40 mL/hr  Plan: Will advance TPN with Clinimix E 5/20 to goal rate of 75 ml/hr and initiate 20% ILE at 15 ml/hr.  Add MVI, trace elements to TPN  Kphos 20 mmol iv once and KCl 10 meq IV x 3. Will recheck K and phos at 1800 and all electrolytes with AM labs tomorrow. Continue q4h SSI TPN labs ordered per protocol  Valentina Guhristy, Soo Steelman D, PharmD, BCPS Clinical Pharmacist 04/03/2017,12:22 PM

## 2017-04-04 LAB — GLUCOSE, CAPILLARY
GLUCOSE-CAPILLARY: 133 mg/dL — AB (ref 65–99)
GLUCOSE-CAPILLARY: 149 mg/dL — AB (ref 65–99)
GLUCOSE-CAPILLARY: 96 mg/dL (ref 65–99)
Glucose-Capillary: 125 mg/dL — ABNORMAL HIGH (ref 65–99)
Glucose-Capillary: 133 mg/dL — ABNORMAL HIGH (ref 65–99)
Glucose-Capillary: 134 mg/dL — ABNORMAL HIGH (ref 65–99)
Glucose-Capillary: 155 mg/dL — ABNORMAL HIGH (ref 65–99)

## 2017-04-04 LAB — MAGNESIUM: Magnesium: 2 mg/dL (ref 1.7–2.4)

## 2017-04-04 LAB — BASIC METABOLIC PANEL
Anion gap: 9 (ref 5–15)
BUN: 7 mg/dL (ref 6–20)
CALCIUM: 7.6 mg/dL — AB (ref 8.9–10.3)
CO2: 24 mmol/L (ref 22–32)
CREATININE: 0.55 mg/dL — AB (ref 0.61–1.24)
Chloride: 102 mmol/L (ref 101–111)
GFR calc Af Amer: >60 mL/min (ref >60)
GLUCOSE: 141 mg/dL — AB (ref 65–99)
Potassium: 3.3 mmol/L — ABNORMAL LOW (ref 3.5–5.1)
Sodium: 135 mmol/L (ref 135–145)

## 2017-04-04 LAB — PHOSPHORUS: Phosphorus: 2.8 mg/dL (ref 2.5–4.6)

## 2017-04-04 MED ORDER — POTASSIUM CHLORIDE 10 MEQ/50ML IV SOLN
10.0000 meq | INTRAVENOUS | Status: AC
Start: 2017-04-04 — End: 2017-04-04
  Administered 2017-04-04 (×4): 10 meq via INTRAVENOUS
  Filled 2017-04-04 (×4): qty 50

## 2017-04-04 MED ORDER — FAT EMULSION 20 % IV EMUL
250.0000 mL | INTRAVENOUS | Status: AC
  Administered 2017-04-04: 250 mL via INTRAVENOUS
  Filled 2017-04-04: qty 250

## 2017-04-04 MED ORDER — TRACE MINERALS CR-CU-MN-SE-ZN 10-1000-500-60 MCG/ML IV SOLN
INTRAVENOUS | Status: AC
  Administered 2017-04-04: 19:00:00 via INTRAVENOUS
  Filled 2017-04-04: qty 1800

## 2017-04-04 NOTE — Progress Notes (Signed)
PHARMACY - ADULT TOTAL PARENTERAL NUTRITION CONSULT NOTE   Pharmacy Consult for TPN Indication: severe pancreatitis  Patient Measurements: Height: 5\' 7"  (170.2 cm) Weight: 164 lb 9.6 oz (74.7 kg) IBW/kg (Calculated) : 66.1 TPN AdjBW (KG): 71.7 Body mass index is 25.78 kg/m.  Assessment: Patient initiated on TPN on 2/16 per surgery for severe pancreatitis. RD following and making recommendations for TPN.  GI:  Endo:  Insulin requirements in the past 24 hours: 9 units Lytes:  Sodium (mmol/L)  Date Value  04/04/2017 135   Potassium (mmol/L)  Date Value  04/04/2017 3.3 (L)   Magnesium (mg/dL)  Date Value  16/10/960402/19/2019 2.0   Phosphorus (mg/dL)  Date Value  54/09/811902/19/2019 2.8   Calcium (mg/dL)  Date Value  14/78/295602/19/2019 7.6 (L)   Albumin (g/dL)  Date Value  21/30/865702/18/2019 2.2 (L)    Renal: Pulm: Cards:  Hepatobil: Neuro: ID:  TPN Access: PICC placed 2/16 TPN start date: 2/16 Nutritional Goals  KCal: 1870 - 2180 Protein: 85 - 100 grams Fluid: 1.8 L/day  Current Nutrition: Clinimix E 5/20 at 75 mL/hr  Plan: Will continue TPN with Clinimix E 5/20 at goal rate of 75 ml/hr and initiate 20% ILE at 15 ml/hr.  Add MVI, trace elements to TPN  KCl 10 meq iv x 4 and will recheck electrolytes with AM labs.  Continue q4h SSI TPN labs ordered per protocol  Valentina Guhristy, Sable Knoles D, PharmD, BCPS Clinical Pharmacist 04/04/2017,1:27 PM

## 2017-04-04 NOTE — Progress Notes (Signed)
Nutrition Follow Up Note    DOCUMENTATION CODES:   Not applicable  INTERVENTION:   Continue Clinimix E 5/20 at 75 mL/hr + 20% ILE at 15 mL/hr over 12 hours.   Regimen provides 1944 kcal, 90 grams of protein, 1980 mL fluid daily.  Continue adult MVI and trace elements as TPN additives.  NUTRITION DIAGNOSIS:   Inadequate oral intake related to inability to eat, acute illness(acute pancreatitis) as evidenced by per patient/family report.  GOAL:   Patient will meet greater than or equal to 90% of their needs  -meeting with TPN  MONITOR:   Diet advancement, Labs, Weight trends, I & O's, Other (Comment)(TPN)  ASSESSMENT:   47 year old male with PMHx of asthma, hx inguinal hernia s/p repair who presented 2/12 with midepigastric abdominal pain worsened post-prandially and N/V found to have severe acute gallstone pancreatitis.   Pt tolerating TPN well; continue at goal rate. Spoke to MD today, plan to initiate pt on clear liquids today. If pt unable to tolerate liquids, plan is to place NJT with enteral feeding tomorrow. Per chart, pt with 12lb weight loss since admit. Pt noted to be +12.5L on I & Os. No edema noted.     Medications reviewed and include: lovenox, insulin, protonix, simethicone, KCl, zofran, tramadol  Labs reviewed: K 3.3(L), creat 0.55(L), Ca 7.6(L), P 2.8 wnl, Mg 2.0 wnl Triglycerides 207(H)- 2/18 cbgs- 157, 133, 141 x 48hrs  Diet Order:  .TPN (CLINIMIX-E) Adult Diet clear liquid Room service appropriate? Yes; Fluid consistency: Thin  EDUCATION NEEDS:   Not appropriate for education at this time  Skin:  Reviewed RN Assessment  Last BM:  2/19  Height:   Ht Readings from Last 1 Encounters:  03/28/17 5\' 7"  (1.702 m)    Weight:   Wt Readings from Last 1 Encounters:  04/03/17 164 lb 9.6 oz (74.7 kg)    Ideal Body Weight:  67.3 kg  BMI:  Body mass index is 25.78 kg/m.  Estimated Nutritional Needs:   Kcal:  1870-2180 (MSJ 1560 x  1.2-1.4)  Protein:  85-100 grams (1.2-1.4 grams/kg)  Fluid:  1.8 L/day (25 mL/kg)  Betsey Holidayasey Naudia Crosley MS, RD, LDN Pager #- [phone] After Hours Pager: [phone]

## 2017-04-05 LAB — BASIC METABOLIC PANEL
Anion gap: 10 (ref 5–15)
BUN: 8 mg/dL (ref 6–20)
CALCIUM: 7.7 mg/dL — AB (ref 8.9–10.3)
CO2: 25 mmol/L (ref 22–32)
CREATININE: 0.67 mg/dL (ref 0.61–1.24)
Chloride: 100 mmol/L — ABNORMAL LOW (ref 101–111)
GFR calc Af Amer: >60 mL/min (ref >60)
GFR calc non Af Amer: >60 mL/min (ref >60)
GLUCOSE: 134 mg/dL — AB (ref 65–99)
Potassium: 3.5 mmol/L (ref 3.5–5.1)
Sodium: 135 mmol/L (ref 135–145)

## 2017-04-05 LAB — GLUCOSE, CAPILLARY
GLUCOSE-CAPILLARY: 149 mg/dL — AB (ref 65–99)
Glucose-Capillary: 137 mg/dL — ABNORMAL HIGH (ref 65–99)
Glucose-Capillary: 152 mg/dL — ABNORMAL HIGH (ref 65–99)
Glucose-Capillary: 106 mg/dL — ABNORMAL HIGH (ref 65–99)
Glucose-Capillary: 114 mg/dL — ABNORMAL HIGH (ref 65–99)

## 2017-04-05 LAB — MAGNESIUM: Magnesium: 2 mg/dL (ref 1.7–2.4)

## 2017-04-05 LAB — PHOSPHORUS: Phosphorus: 3.4 mg/dL (ref 2.5–4.6)

## 2017-04-05 NOTE — Progress Notes (Signed)
Patient discharge teaching given, including activity, diet, follow-up appoints, and medications. Patient verbalized understanding of all discharge instructions. PICC access was d/c'd, dressing clean and intact,Vitals are stable. Pt.'s last CBG was checked 1822 and was 114. Skin is intact except as charted in most recent assessments. Pt to be escorted out by RN, to be driven home by family.  Warren David [place]

## 2017-04-05 NOTE — Care Management (Signed)
Patient to discharge today.  PCP listed as Alphonsus SiasLetvak.  Patient provided with application to Medication Management, [facility], and "The Network:  Your Guide [facility] [employer] [facility] [place]"  Booklet.  Patient does not have any prescriptions at discharge. RNCM signing off

## 2017-04-05 NOTE — Discharge Summary (Signed)
[facility] - [place] at [facility]   PATIENT NAME: Warren David    MR#:  [id number]  DATE OF BIRTH:  01/11/1971  DATE OF ADMISSION:  03/28/2017 ADMITTING PHYSICIAN: Adrian SaranSital Brayla Pat, MD  DATE OF DISCHARGE: 04/05/2017  PRIMARY CARE PHYSICIAN: Karie SchwalbeLetvak, Richard I, MD    ADMISSION DIAGNOSIS:  Choledocholithiasis with acute cholecystitis [K80.42] Pancreatitis [K85.90] Acute gallstone pancreatitis [K85.10]  DISCHARGE DIAGNOSIS:  Active Problems:   Pancreatitis      SECONDARY DIAGNOSIS:   Past Medical History:  Diagnosis Date  . Asthma   . Hiatal hernia     HOSPITAL COURSE:   47 year old male with mild intermittent asthma who presented with abdominal pain.   1. Acute pancreatitis with pancreatitis ascites  Patient had prolonged hospitalization due to acute pancreatitis. He was started on TPN due to persistent abdominal pain. His pain has subsided. He is tolerating his diet.   2. Mild intermittent asthma without signs of exacerbation  3. Hypokalemia: This was repleted     DISCHARGE CONDITIONS AND DIET:   Stable for discharge on regular diet  CONSULTS OBTAINED:  Treatment Team:  Henrene DodgePiscoya, Jose, MD  DRUG ALLERGIES:   Allergies  Allergen Reactions  . Fluticasone-Salmeterol     REACTION: palpatations    DISCHARGE MEDICATIONS:   Allergies as of 04/05/2017      Reactions   Fluticasone-salmeterol    REACTION: palpatations      Medication List    TAKE these medications   Albuterol Sulfate 108 (90 Base) MCG/ACT Aepb Commonly known as:  PROAIR RESPICLICK Inhale 2 puffs into the lungs 4 (four) times daily as needed.   fluticasone 50 MCG/ACT nasal spray Commonly known as:  FLONASE Place into both nostrils daily.   loratadine 10 MG tablet Commonly known as:  CLARITIN Take 10 mg by mouth daily.   omeprazole 40 MG capsule Commonly known as:  PRILOSEC Take 40 mg by mouth daily.         Today   CHIEF COMPLAINT:  Doing well this  morning. Tolerating diet well.   VITAL SIGNS:  Blood pressure (!) 147/90, pulse (!) 103, temperature 99.3 F (37.4 C), temperature source Oral, resp. rate 18, height 5\' 7"  (1.702 m), weight 74.7 kg (164 lb 9.6 oz), SpO2 94 %.   REVIEW OF SYSTEMS:  Review of Systems  Constitutional: Negative.  Negative for chills, fever and malaise/fatigue.  HENT: Negative.  Negative for ear discharge, ear pain, hearing loss, nosebleeds and sore throat.   Eyes: Negative.  Negative for blurred vision and pain.  Respiratory: Negative.  Negative for cough, hemoptysis, shortness of breath and wheezing.   Cardiovascular: Negative.  Negative for chest pain, palpitations and leg swelling.  Gastrointestinal: Negative.  Negative for abdominal pain, blood in stool, diarrhea, nausea and vomiting.  Genitourinary: Negative.  Negative for dysuria.  Musculoskeletal: Negative.  Negative for back pain.  Skin: Negative.   Neurological: Negative for dizziness, tremors, speech change, focal weakness, seizures and headaches.  Endo/Heme/Allergies: Negative.  Does not bruise/bleed easily.  Psychiatric/Behavioral: Negative.  Negative for depression, hallucinations and suicidal ideas.     PHYSICAL EXAMINATION:  GENERAL:  47 y.o.-year-old patient lying in the bed with no acute distress.  NECK:  Supple, no jugular venous distention. No thyroid enlargement, no tenderness.  LUNGS: Normal breath sounds bilaterally, no wheezing, rales,rhonchi  No use of accessory muscles of respiration.  CARDIOVASCULAR: S1, S2 normal. No murmurs, rubs, or gallops.  ABDOMEN: Soft, non-tender, non-distended. Bowel sounds present. No organomegaly or mass.  EXTREMITIES: No pedal edema, cyanosis, or clubbing.  PSYCHIATRIC: The patient is alert and oriented x 3.  SKIN: No obvious rash, lesion, or ulcer.   DATA REVIEW:   CBC Recent Labs  Lab 04/03/17 0340  WBC 14.0*  HGB 11.3*  HCT 33.9*  PLT 234    Chemistries  Recent Labs  Lab  04/03/17 0340  04/05/17 0349  NA 135   < > 135  K 3.1*   < > 3.5  CL 102   < > 100*  CO2 26   < > 25  GLUCOSE 133*   < > 134*  BUN 9   < > 8  CREATININE 0.69   < > 0.67  CALCIUM 7.5*   < > 7.7*  MG 1.9   < > 2.0  AST 39  --   --   ALT 51  --   --   ALKPHOS 163*  --   --   BILITOT 2.2*  --   --    < > = values in this interval not displayed.    Cardiac Enzymes No results for input(s): TROPONINI in the last 168 hours.  Microbiology Results  @MICRORSLT48 @  RADIOLOGY:  No results found.    Allergies as of 04/05/2017      Reactions   Fluticasone-salmeterol    REACTION: palpatations      Medication List    TAKE these medications   Albuterol Sulfate 108 (90 Base) MCG/ACT Aepb Commonly known as:  PROAIR RESPICLICK Inhale 2 puffs into the lungs 4 (four) times daily as needed.   fluticasone 50 MCG/ACT nasal spray Commonly known as:  FLONASE Place into both nostrils daily.   loratadine 10 MG tablet Commonly known as:  CLARITIN Take 10 mg by mouth daily.   omeprazole 40 MG capsule Commonly known as:  PRILOSEC Take 40 mg by mouth daily.          Management plans discussed with the patient and he is in agreement. Stable for discharge home  Patient should follow up with pcp  CODE STATUS:     Code Status Orders  (From admission, onward)        Start     Ordered   03/28/17 1722  Full code  Continuous     03/28/17 1721    Code Status History    Date Active Date Inactive Code Status Order ID Comments User Context   This patient has a current code status but no historical code status.      TOTAL TIME TAKING CARE OF THIS PATIENT: 37 minutes.    Note: This dictation was prepared with Dragon dictation along with smaller phrase technology. Any transcriptional errors that result from this process are unintentional.  Darling Cieslewicz M.D on 04/05/2017 at 10:22 AM  Between 7am to 6pm - Pager - [phone] After 6pm go to [email or web address] - Air traffic controller  [facility]  Office  [phone]  CC: Primary care physician; Karie Schwalbe, MD

## 2017-04-05 NOTE — Progress Notes (Signed)
Nutrition Education Note  RD educated patient today regarding a pancreatitis diet  RD provided "Nutrition Therapy with Pancreatitis" handout from the Academy of Nutrition and Dietetics. RD also provided a list of low fat vs high fat foods. Reviewed patient's dietary recall. Advised patient to eat multiple small meals and to avoid high fat foods, spicy foods, and alcohol. Educated pt on the importance of adequate protein intake needed to preserve lean muscle. Gave patient examples of meals and menu planning.   Teach back method used.  Expect good compliance.  Body mass index is 25.78 kg/m. Pt meets criteria for overweight based on current BMI.  RD following this pt  Betsey Holidayasey Reveca Desmarais MS, RD, LDN Pager #[phone] After Hours Pager: [phone]

## 2017-04-07 ENCOUNTER — Ambulatory Visit (INDEPENDENT_AMBULATORY_CARE_PROVIDER_SITE_OTHER): Payer: Self-pay | Admitting: Internal Medicine

## 2017-04-07 ENCOUNTER — Encounter: Payer: Self-pay | Admitting: Internal Medicine

## 2017-04-07 VITALS — BP 118/76 | HR 98 | Temp 98.4°F | Wt 156.0 lb

## 2017-04-07 DIAGNOSIS — K8511 Biliary acute pancreatitis with uninfected necrosis: Secondary | ICD-10-CM

## 2017-04-07 NOTE — Assessment & Plan Note (Addendum)
Still symptomatic but some better Reviewed all hospital records Had gallstone but no gallbladder thickening----still leaves the concern about something else as cause of the pancreatitis May still have small pleural effusions as well  Discussed nutrition--can try small amounts of boost to supplement the food Will need repeat CT before considering surgery---but at this point, that would still be the plan Needs to set up surgical follow up

## 2017-04-07 NOTE — Progress Notes (Addendum)
   Subjective:    Patient ID: Warren David, male    DOB: 01/07/1971, 47 y.o.   MRN: [id number]  HPI Here for hospital follow up and to reestablish With wife  Had been having several weeks of post prandial dull ache in mid abdomen Also some tension as well Related it to hiatal hernia and reflux --tried baking soda No N/V then Appetite fine and weight stable  Not eating healthy  Only occasional beer or wine Only med prilosec  Then severe attack after eating chinese food---was screaming in pain Wife brought him to hospital Diagnosed with pancreatitis--quite severe No fever Extended time course in hospital--- 8 days Unable to tolerate even fluids for some time TPN for 3-4 days after relapsed on food Had PICC line Then finally tolerated fluids and food  Home 2 days ago No analgesics Limiting fat now and reduced sugar intake ---wife managing Not able to eat a lot yet   Review of Systems  Constitutional: Positive for fatigue.       Has lost 5# lately  Eyes:       Some trouble with distance focusing  Respiratory: Positive for cough. Negative for shortness of breath.        Dry tickle in throat  Cardiovascular: Negative for chest pain, palpitations and leg swelling.  Gastrointestinal: Positive for abdominal distention and nausea. Negative for blood in stool and vomiting.  Endocrine: Negative for polydipsia and polyuria.  Genitourinary: Positive for urgency. Negative for dysuria and hematuria.  Musculoskeletal: Negative for arthralgias and back pain.  Skin: Negative for rash.  Neurological: Positive for light-headedness. Negative for dizziness and syncope.  Psychiatric/Behavioral: Positive for sleep disturbance. Negative for dysphoric mood.       Objective:   Physical Exam  Constitutional: No distress.  HENT:  Mouth/Throat: Oropharynx is clear and moist. No oropharyngeal exudate.  Neck: No thyromegaly present.  Cardiovascular: Normal rate, regular rhythm and  normal heart sounds. Exam reveals no gallop.  No murmur heard. Pulmonary/Chest: Effort normal and breath sounds normal. No respiratory distress. He has no wheezes. He has no rales.  Slight bibasilar dullness  Abdominal:  Mild distention Bruising from lovenox Decreased bowel sounds Mild generalized tenderness  Musculoskeletal: He exhibits no edema.  Lymphadenopathy:    He has no cervical adenopathy.  Psychiatric: He has a normal mood and affect. His behavior is normal.          Assessment & Plan:

## 2017-04-12 ENCOUNTER — Inpatient Hospital Stay: Payer: Self-pay | Admitting: Internal Medicine

## 2017-05-18 ENCOUNTER — Other Ambulatory Visit
Admission: RE | Admit: 2017-05-18 | Discharge: 2017-05-18 | Disposition: A | Discharge status: Home / Self Care | Payer: Self-pay | Source: Ambulatory Visit | Attending: Surgery | Admitting: Surgery

## 2017-05-18 ENCOUNTER — Ambulatory Visit: Payer: Self-pay | Admitting: Surgery

## 2017-05-18 ENCOUNTER — Encounter: Payer: Self-pay | Admitting: Surgery

## 2017-05-18 VITALS — BP 133/86 | HR 101 | Temp 97.9°F | Ht 68.0 in | Wt 147.6 lb

## 2017-05-18 DIAGNOSIS — K8511 Biliary acute pancreatitis with uninfected necrosis: Secondary | ICD-10-CM

## 2017-05-18 DIAGNOSIS — R1013 Epigastric pain: Secondary | ICD-10-CM

## 2017-05-18 LAB — COMPREHENSIVE METABOLIC PANEL
ALBUMIN: 4.6 g/dL (ref 3.5–5.0)
ALT: 69 U/L — ABNORMAL HIGH (ref 17–63)
ANION GAP: 8 (ref 5–15)
AST: 56 U/L — AB (ref 15–41)
Alkaline Phosphatase: 156 U/L — ABNORMAL HIGH (ref 38–126)
BILIRUBIN TOTAL: 0.8 mg/dL (ref 0.3–1.2)
BUN: 14 mg/dL (ref 6–20)
CHLORIDE: 101 mmol/L (ref 101–111)
CO2: 30 mmol/L (ref 22–32)
Calcium: 9.4 mg/dL (ref 8.9–10.3)
Creatinine, Ser: 1.01 mg/dL (ref 0.61–1.24)
GFR calc Af Amer: >60 mL/min (ref >60)
GFR calc non Af Amer: >60 mL/min (ref >60)
GLUCOSE: 93 mg/dL (ref 65–99)
POTASSIUM: 4 mmol/L (ref 3.5–5.1)
SODIUM: 139 mmol/L (ref 135–145)
TOTAL PROTEIN: 7.6 g/dL (ref 6.5–8.1)

## 2017-05-18 LAB — CBC WITH DIFFERENTIAL/PLATELET
BASOS PCT: 1 %
Basophils Absolute: 0 10*3/uL (ref 0–0.1)
EOS ABS: 0.2 10*3/uL (ref 0–0.7)
Eosinophils Relative: 4 %
HEMATOCRIT: 42.6 % (ref 40.0–52.0)
Hemoglobin: 14.3 g/dL (ref 13.0–18.0)
Lymphocytes Relative: 24 %
Lymphs Abs: 1.4 10*3/uL (ref 1.0–3.6)
MCH: 30.7 pg (ref 26.0–34.0)
MCHC: 33.6 g/dL (ref 32.0–36.0)
MCV: 91.3 fL (ref 80.0–100.0)
MONO ABS: 0.4 10*3/uL (ref 0.2–1.0)
Monocytes Relative: 8 %
NEUTROS ABS: 3.8 10*3/uL (ref 1.4–6.5)
Neutrophils Relative %: 63 %
Platelets: 213 10*3/uL (ref 150–440)
RBC: 4.66 MIL/uL (ref 4.40–5.90)
RDW: 13 % (ref 11.5–14.5)
WBC: 5.9 10*3/uL (ref 3.8–10.6)

## 2017-05-18 NOTE — Patient Instructions (Addendum)
Please stop by the lab today on your way out.  Your CT scan is scheduled for 05/26/17 @ 7:45 am .Nothing to eat/drink 4 hours prior.  Please stop by to pick up the prep instructions.   [facility] [address] [place]  Please see your blue-pre care sheet for surgery information.

## 2017-05-18 NOTE — Progress Notes (Signed)
Outpatient Surgical Follow Up  05/18/2017  Warren David is an [age] y.o. male.   CC: Biliary pancreatitis  Consult requested by hospitalist  HPI: This a patient with known biliary pancreatitis.  He experienced a prolonged hospital stay with considerable fluid around his pancreas likely secondary to biliary source.  He never showed signs of acute cholecystitis in spite of the fact that that is on his problem list (choledocholithiasis with acute cholecystitis). Still with considerable pain, esp after meals.  Past Medical History:  Diagnosis Date  . Acute pancreatitis 03/28/2017  . Asthma   . Choledocholithiasis with acute cholecystitis   . Hiatal hernia   . 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 ESOPHAGOGASTRODUODENOSCOPY (EGD) WITH PROPOFOL N/A 03/01/2016   Procedure: ESOPHAGOGASTRODUODENOSCOPY (EGD) WITH PROPOFOL;  Surgeon: Midge Miniumarren Wohl, MD;  Location: [facility] ENDOSCOPY;  Service: Endoscopy;  Laterality: N/A;  . HYDROCELE EXCISION    . INGUINAL HERNIA REPAIR      No family history on file.  Social History:  reports that he has never smoked. He has never used smokeless tobacco. He reports that he drinks alcohol. He reports that he does not use drugs.  Allergies:  Allergies  Allergen Reactions  . Fluticasone-Salmeterol     REACTION: palpatations    Medications reviewed.   Review of Systems:   Review of Systems  Constitutional: Negative.   HENT: Negative.   Eyes: Negative.   Respiratory: Negative.   Cardiovascular: Negative.   Gastrointestinal: Positive for abdominal pain and nausea. Negative for blood in stool, constipation, diarrhea, heartburn, melena and vomiting.  Genitourinary: Negative.   Musculoskeletal: Negative.   Skin: Negative.   Neurological: Negative.   Endo/Heme/Allergies: Negative.   Psychiatric/Behavioral: Negative.      Physical  Exam:  There were no vitals taken for this visit.  Physical Exam  Constitutional: He is oriented to person, place, and time and well-developed, well-nourished, and in no distress. No distress.  HENT:  Head: Normocephalic and atraumatic.  Eyes: Pupils are equal, round, and reactive to light. Right eye exhibits no discharge. Left eye exhibits no discharge. No scleral icterus.  Neck: Normal range of motion. No JVD present.  Cardiovascular: Normal rate and regular rhythm.  Pulmonary/Chest: Effort normal. No respiratory distress.  Abdominal: Soft. He exhibits no distension. There is no tenderness. There is no rebound and no guarding.  Musculoskeletal: Normal range of motion. He exhibits no edema.  Lymphadenopathy:    He has no cervical adenopathy.  Neurological: He is alert and oriented to person, place, and time.  Skin: Skin is warm and dry. No rash noted. He is not diaphoretic. No erythema.  Vitals reviewed.     No results found for this or any previous visit (from the past 48 hour(s)). No results found.  There are no new labs since February at which time his bilirubin was slightly elevated. Labs will be ordered.  No new studies.   Assessment/Plan:  This a patient with clear-cut signs of biliary pancreatitis.  He still has some pain and has been told that he has US[employer]ill Bears disease.  His only elevation of the LFT prior to discharge in February was an elevated total bilirubin of 2.4 or 2.5.  This may well be US[employer]ill Bears disease.  However in the face of biliary pancreatitis we have to be concerned about ongoing retained common bile duct stone or stricture  or ongoing inflammation.  I would like to obtain a CT scan to assess for pseudocyst etc. as far as resolution of his pancreatitis.  I would also like to obtain CBC and CMP.  We will call him with those results but will likely proceed with laparoscopic cholecystectomy with cholangiography in the next 2 weeks.  I discussed with he and  his wife the rationale for offering surgery the risks of bleeding infection conversion to an open procedure bile duct damage bile duct leak retained stone requiring ERCP and stone extraction and the potential for not performing the surgery at this time if pseudocysts or considerable inflammation is still present.  Questions were answered for them they understood and agreed to proceed  Lattie Haw, MD, FACS

## 2017-05-23 ENCOUNTER — Telehealth: Payer: Self-pay | Admitting: Surgery

## 2017-05-23 NOTE — Telephone Encounter (Signed)
Pt advised of pre op date/time and sx date. Sx: 05/30/17 with Dr Ludwig Clarksooper--laparoscopic cholecystectomy with gram.  Pre op: 05/24/17 between 9-1:00pm--phone interview.   Patient made aware to call [phone], between 1-3:00pm the day before surgery, to find out what time to arrive.

## 2017-05-24 ENCOUNTER — Encounter
Admission: RE | Admit: 2017-05-24 | Discharge: 2017-05-24 | Disposition: A | Discharge status: Home / Self Care | Payer: Self-pay | Source: Ambulatory Visit | Attending: Surgery | Admitting: Surgery

## 2017-05-24 ENCOUNTER — Other Ambulatory Visit: Payer: Self-pay

## 2017-05-24 HISTORY — DX: Headache, unspecified: R51.9

## 2017-05-24 HISTORY — DX: Gastro-esophageal reflux disease without esophagitis: K21.9

## 2017-05-24 HISTORY — DX: Headache: R51

## 2017-05-24 NOTE — Patient Instructions (Signed)
Your procedure is scheduled on: 05-30-17 TUESDAY Report to Same Day Surgery 2nd floor medical mall [facility](Medical Mall Entrance-take elevator on left to 2nd floor.  Check in with surgery information desk.) To find out your arrival time please call [phone] between 1PM - 3PM on 05-29-17 MONDAY  Remember: Instructions that are not followed completely may result in serious medical risk, up to and including death, or upon the discretion of your surgeon and anesthesiologist your surgery may need to be rescheduled.    _x___ 1. Do not eat food after midnight the night before your procedure. NO GUM OR CANDY AFTER MIDNIGHT.  You may drink clear liquids up to 2 hours before you are scheduled to arrive at the hospital for your procedure.  Do not drink clear liquids within 2 hours of your scheduled arrival to the hospital.  Clear liquids include  --Water or Apple juice without pulp  --Clear carbohydrate beverage such as ClearFast or Gatorade  --Black Coffee or Clear Tea (No milk, no creamers, do not add anything to the coffee or Tea     __x__ 2. No Alcohol for 24 hours before or after surgery.   __x__3. No Smoking or e-cigarettes for 24 prior to surgery.  Do not use any chewable tobacco products for at least 6 hour prior to surgery   ____  4. Bring all medications with you on the day of surgery if instructed.    __x__ 5. Notify your doctor if there is any change in your medical condition     (cold, fever, infections).    x___6. On the morning of surgery brush your teeth with toothpaste and water.  You may rinse your mouth with mouth wash if you wish.  Do not swallow any toothpaste or mouthwash.   Do not wear jewelry, make-up, hairpins, clips or nail polish.  Do not wear lotions, powders, or perfumes. You may wear deodorant.  Do not shave 48 hours prior to surgery. Men may shave face and neck.  Do not bring valuables to the hospital.    [facility] is not responsible for any belongings or  valuables.               Contacts, dentures or bridgework may not be worn into surgery.  Leave your suitcase in the car. After surgery it may be brought to your room.  For patients admitted to the hospital, discharge time is determined by your treatment team.  _  Patients discharged the day of surgery will not be allowed to drive home.  You will need someone to drive you home and stay with you the night of your procedure.    Please read over the following fact sheets that you were given:   [facility] Preparing for Surgery and or MRSA Information   _x___ TAKE THE FOLLOWING MEDICATION THE MORNING OF SURGERY . These include:  1. PRILOSEC  2.  3.  4.  5.  6.  ____Fleets enema or Magnesium Citrate as directed.   ____ Use CHG Soap or sage wipes as directed on instruction sheet   _X___ Use inhalers on the day of surgery and bring to hospital day of surgery-USE ALBUTEROL INHALER AT HOME AND BRING TO HOSPITAL  ____ Stop Metformin and Janumet 2 days prior to surgery.    ____ Take 1/2 of usual insulin dose the night before surgery and none on the morning surgery.   ____ Follow recommendations from Cardiologist, Pulmonologist or PCP regarding  stopping Aspirin, Coumadin, Plavix ,Eliquis,  Effient, or Pradaxa, and Pletal.  X____Stop Anti-inflammatories such as Advil, Aleve, Ibuprofen, Motrin, Naproxen, Naprosyn, Goodies powders or aspirin products NOW-OK to take Tylenol    ____ Stop supplements until after surgery.     ____ Bring C-Pap to the hospital.

## 2017-05-26 ENCOUNTER — Ambulatory Visit
Admission: RE | Admit: 2017-05-26 | Discharge: 2017-05-26 | Disposition: A | Discharge status: Home / Self Care | Payer: Self-pay | Source: Ambulatory Visit | Attending: Surgery | Admitting: Surgery

## 2017-05-26 DIAGNOSIS — R911 Solitary pulmonary nodule: Secondary | ICD-10-CM | POA: Insufficient documentation

## 2017-05-26 DIAGNOSIS — R933 Abnormal findings on diagnostic imaging of other parts of digestive tract: Secondary | ICD-10-CM | POA: Insufficient documentation

## 2017-05-26 DIAGNOSIS — R109 Unspecified abdominal pain: Secondary | ICD-10-CM | POA: Insufficient documentation

## 2017-05-26 DIAGNOSIS — R1013 Epigastric pain: Secondary | ICD-10-CM

## 2017-05-26 DIAGNOSIS — K859 Acute pancreatitis without necrosis or infection, unspecified: Secondary | ICD-10-CM | POA: Insufficient documentation

## 2017-05-26 DIAGNOSIS — I700 Atherosclerosis of aorta: Secondary | ICD-10-CM | POA: Insufficient documentation

## 2017-05-26 DIAGNOSIS — N200 Calculus of kidney: Secondary | ICD-10-CM | POA: Insufficient documentation

## 2017-05-26 MED ORDER — IOPAMIDOL (ISOVUE-300) INJECTION 61%
85.0000 mL | Freq: Once | INTRAVENOUS | Status: AC | PRN
  Administered 2017-05-26: 85 mL via INTRAVENOUS

## 2017-05-29 ENCOUNTER — Other Ambulatory Visit: Payer: Self-pay | Admitting: Surgery

## 2017-05-29 DIAGNOSIS — K8511 Biliary acute pancreatitis with uninfected necrosis: Secondary | ICD-10-CM

## 2017-05-30 ENCOUNTER — Encounter: Payer: Self-pay | Admitting: Anesthesiology

## 2017-05-30 ENCOUNTER — Ambulatory Visit: Payer: Self-pay

## 2017-05-30 ENCOUNTER — Ambulatory Visit: Payer: Self-pay | Admitting: Anesthesiology

## 2017-05-30 ENCOUNTER — Ambulatory Visit
Admission: RE | Admit: 2017-05-30 | Discharge: 2017-05-30 | Disposition: A | Discharge status: Home / Self Care | Payer: Self-pay | Source: Ambulatory Visit | Attending: Surgery | Admitting: Surgery

## 2017-05-30 ENCOUNTER — Encounter
Admission: RE | Disposition: A | Discharge status: Home / Self Care | Payer: Self-pay | Source: Ambulatory Visit | Attending: Surgery

## 2017-05-30 ENCOUNTER — Other Ambulatory Visit: Payer: Self-pay

## 2017-05-30 DIAGNOSIS — K8020 Calculus of gallbladder without cholecystitis without obstruction: Secondary | ICD-10-CM

## 2017-05-30 DIAGNOSIS — K8010 Calculus of gallbladder with chronic cholecystitis without obstruction: Secondary | ICD-10-CM | POA: Insufficient documentation

## 2017-05-30 DIAGNOSIS — K8511 Biliary acute pancreatitis with uninfected necrosis: Secondary | ICD-10-CM

## 2017-05-30 HISTORY — PX: CHOLECYSTECTOMY: SHX55

## 2017-05-30 SURGERY — LAPAROSCOPIC CHOLECYSTECTOMY WITH INTRAOPERATIVE CHOLANGIOGRAM
Anesthesia: General | Site: Abdomen | Wound class: Clean Contaminated

## 2017-05-30 MED ORDER — LIDOCAINE HCL (PF) 2 % IJ SOLN
INTRAMUSCULAR | Status: AC
  Filled 2017-05-30: qty 10

## 2017-05-30 MED ORDER — KETOROLAC TROMETHAMINE 30 MG/ML IJ SOLN
INTRAMUSCULAR | Status: AC
  Filled 2017-05-30: qty 1

## 2017-05-30 MED ORDER — GLYCOPYRROLATE 0.2 MG/ML IJ SOLN
INTRAMUSCULAR | Status: AC
  Filled 2017-05-30: qty 1

## 2017-05-30 MED ORDER — FENTANYL CITRATE (PF) 100 MCG/2ML IJ SOLN
INTRAMUSCULAR | Status: DC | PRN
  Administered 2017-05-30 (×2): 100 ug via INTRAVENOUS

## 2017-05-30 MED ORDER — SUGAMMADEX SODIUM 200 MG/2ML IV SOLN
INTRAVENOUS | Status: DC | PRN
  Administered 2017-05-30: 200 mg via INTRAVENOUS

## 2017-05-30 MED ORDER — KETOROLAC TROMETHAMINE 30 MG/ML IJ SOLN
INTRAMUSCULAR | Status: DC | PRN
  Administered 2017-05-30: 30 mg via INTRAVENOUS

## 2017-05-30 MED ORDER — LACTATED RINGERS IV SOLN
INTRAVENOUS | Status: DC
  Administered 2017-05-30: 14:00:00 via INTRAVENOUS

## 2017-05-30 MED ORDER — FENTANYL CITRATE (PF) 100 MCG/2ML IJ SOLN
25.0000 ug | INTRAMUSCULAR | Status: DC | PRN
  Administered 2017-05-30 (×4): 25 ug via INTRAVENOUS

## 2017-05-30 MED ORDER — HEPARIN SODIUM (PORCINE) 5000 UNIT/ML IJ SOLN
5000.0000 [IU] | Freq: Once | INTRAMUSCULAR | Status: AC
  Administered 2017-05-30: 5000 [IU] via SUBCUTANEOUS

## 2017-05-30 MED ORDER — HEPARIN SODIUM (PORCINE) 5000 UNIT/ML IJ SOLN
INTRAMUSCULAR | Status: AC
  Administered 2017-05-30: 5000 [IU] via SUBCUTANEOUS
  Filled 2017-05-30: qty 1

## 2017-05-30 MED ORDER — SUGAMMADEX SODIUM 200 MG/2ML IV SOLN
INTRAVENOUS | Status: AC
  Filled 2017-05-30: qty 2

## 2017-05-30 MED ORDER — HYDROCODONE-ACETAMINOPHEN 5-325 MG PO TABS
ORAL_TABLET | ORAL | Status: AC
  Filled 2017-05-30: qty 1

## 2017-05-30 MED ORDER — ACETAMINOPHEN 10 MG/ML IV SOLN
INTRAVENOUS | Status: DC | PRN
  Administered 2017-05-30: 1000 mg via INTRAVENOUS

## 2017-05-30 MED ORDER — IOTHALAMATE MEGLUMINE 60 % INJ SOLN
INTRAMUSCULAR | Status: DC | PRN
  Administered 2017-05-30: 13 mL

## 2017-05-30 MED ORDER — ROCURONIUM BROMIDE 100 MG/10ML IV SOLN
INTRAVENOUS | Status: DC | PRN
  Administered 2017-05-30: 40 mg via INTRAVENOUS

## 2017-05-30 MED ORDER — CEFAZOLIN SODIUM-DEXTROSE 2-4 GM/100ML-% IV SOLN
2.0000 g | INTRAVENOUS | Status: AC
  Administered 2017-05-30: 2 g via INTRAVENOUS
  Filled 2017-05-30: qty 100

## 2017-05-30 MED ORDER — CEFAZOLIN SODIUM-DEXTROSE 2-3 GM-%(50ML) IV SOLR
INTRAVENOUS | Status: AC
  Filled 2017-05-30: qty 50

## 2017-05-30 MED ORDER — PROPOFOL 10 MG/ML IV BOLUS
INTRAVENOUS | Status: AC
Start: 2017-05-30 — End: 2017-05-30
  Filled 2017-05-30: qty 20

## 2017-05-30 MED ORDER — DEXAMETHASONE SODIUM PHOSPHATE 10 MG/ML IJ SOLN
INTRAMUSCULAR | Status: DC | PRN
  Administered 2017-05-30: 10 mg via INTRAVENOUS

## 2017-05-30 MED ORDER — CHLORHEXIDINE GLUCONATE CLOTH 2 % EX PADS: 6.0000 | MEDICATED_PAD | Freq: Once | CUTANEOUS | Status: DC

## 2017-05-30 MED ORDER — HYDROCODONE-ACETAMINOPHEN 5-300 MG PO TABS: 1.0000 | ORAL_TABLET | Freq: Four times a day (QID) | ORAL | 0 refills | Status: DC | PRN

## 2017-05-30 MED ORDER — FENTANYL CITRATE (PF) 100 MCG/2ML IJ SOLN
INTRAMUSCULAR | Status: AC
  Administered 2017-05-30: 25 ug via INTRAVENOUS
  Filled 2017-05-30: qty 2

## 2017-05-30 MED ORDER — MIDAZOLAM HCL 2 MG/2ML IJ SOLN
INTRAMUSCULAR | Status: DC | PRN
  Administered 2017-05-30: 2 mg via INTRAVENOUS

## 2017-05-30 MED ORDER — SODIUM CHLORIDE 0.9 % IJ SOLN
INTRAMUSCULAR | Status: AC
  Filled 2017-05-30: qty 10

## 2017-05-30 MED ORDER — ACETAMINOPHEN 10 MG/ML IV SOLN
INTRAVENOUS | Status: AC
  Filled 2017-05-30: qty 100

## 2017-05-30 MED ORDER — ONDANSETRON HCL 4 MG/2ML IJ SOLN
INTRAMUSCULAR | Status: DC | PRN
  Administered 2017-05-30: 4 mg via INTRAVENOUS

## 2017-05-30 MED ORDER — ONDANSETRON HCL 4 MG/2ML IJ SOLN
4.0000 mg | Freq: Once | INTRAMUSCULAR | Status: AC | PRN
  Administered 2017-05-30: 4 mg via INTRAVENOUS

## 2017-05-30 MED ORDER — PROPOFOL 10 MG/ML IV BOLUS
INTRAVENOUS | Status: DC | PRN
  Administered 2017-05-30: 150 mg via INTRAVENOUS

## 2017-05-30 MED ORDER — ONDANSETRON HCL 4 MG/2ML IJ SOLN
INTRAMUSCULAR | Status: AC
  Administered 2017-05-30: 4 mg via INTRAVENOUS
  Filled 2017-05-30: qty 2

## 2017-05-30 MED ORDER — BUPIVACAINE-EPINEPHRINE (PF) 0.25% -1:200000 IJ SOLN
INTRAMUSCULAR | Status: DC | PRN
  Administered 2017-05-30: 30 mL via PERINEURAL

## 2017-05-30 MED ORDER — FENTANYL CITRATE (PF) 100 MCG/2ML IJ SOLN
INTRAMUSCULAR | Status: AC
  Filled 2017-05-30: qty 2

## 2017-05-30 MED ORDER — SUCCINYLCHOLINE CHLORIDE 20 MG/ML IJ SOLN
INTRAMUSCULAR | Status: AC
  Filled 2017-05-30: qty 1

## 2017-05-30 MED ORDER — ROCURONIUM BROMIDE 50 MG/5ML IV SOLN
INTRAVENOUS | Status: AC
  Filled 2017-05-30: qty 1

## 2017-05-30 MED ORDER — ONDANSETRON HCL 4 MG/2ML IJ SOLN
INTRAMUSCULAR | Status: AC
  Filled 2017-05-30: qty 2

## 2017-05-30 MED ORDER — DEXAMETHASONE SODIUM PHOSPHATE 10 MG/ML IJ SOLN
INTRAMUSCULAR | Status: AC
  Filled 2017-05-30: qty 1

## 2017-05-30 MED ORDER — LIDOCAINE HCL (CARDIAC) 20 MG/ML IV SOLN
INTRAVENOUS | Status: DC | PRN
  Administered 2017-05-30: 50 mg via INTRAVENOUS

## 2017-05-30 MED ORDER — MIDAZOLAM HCL 2 MG/2ML IJ SOLN
INTRAMUSCULAR | Status: AC
  Filled 2017-05-30: qty 2

## 2017-05-30 MED ORDER — HYDROCODONE-ACETAMINOPHEN 5-325 MG PO TABS
1.0000 | ORAL_TABLET | Freq: Four times a day (QID) | ORAL | Status: DC | PRN
  Administered 2017-05-30: 1 via ORAL

## 2017-05-30 SURGICAL SUPPLY — 46 items
ADH LQ OCL WTPRF AMP STRL LF (MISCELLANEOUS) ×1
ADHESIVE MASTISOL STRL (MISCELLANEOUS) ×3 IMPLANT
APPLIER CLIP ROT 10 11.4 M/L (STAPLE) ×3
APR CLP MED LRG 11.4X10 (STAPLE) ×1
BAG SPEC RTRVL LRG 6X4 10 (ENDOMECHANICALS) ×1
BLADE SURG SZ11 CARB STEEL (BLADE) ×3 IMPLANT
CANISTER SUCT 1200ML W/VALVE (MISCELLANEOUS) ×3 IMPLANT
CATH CHOLANGI 4FR 420404F (CATHETERS) ×2 IMPLANT
CHLORAPREP W/TINT 26ML (MISCELLANEOUS) ×3 IMPLANT
CLIP APPLIE ROT 10 11.4 M/L (STAPLE) ×1 IMPLANT
CLOSURE WOUND 1/2 X4 (GAUZE/BANDAGES/DRESSINGS) ×1
CONRAY 60ML FOR OR (MISCELLANEOUS) ×2 IMPLANT
DRAPE C-ARM XRAY 36X54 (DRAPES) ×2 IMPLANT
ELECT REM PT RETURN 9FT ADLT (ELECTROSURGICAL) ×3
ELECTRODE REM PT RTRN 9FT ADLT (ELECTROSURGICAL) ×1 IMPLANT
GLOVE BIO SURGEON STRL SZ8 (GLOVE) ×3 IMPLANT
GOWN STRL REUS W/ TWL LRG LVL3 (GOWN DISPOSABLE) ×4 IMPLANT
GOWN STRL REUS W/TWL LRG LVL3 (GOWN DISPOSABLE) ×12
IRRIGATION STRYKERFLOW (MISCELLANEOUS) ×1 IMPLANT
IRRIGATOR STRYKERFLOW (MISCELLANEOUS) ×3
IV CATH ANGIO 12GX3 LT BLUE (NEEDLE) ×3 IMPLANT
IV NS 1000ML (IV SOLUTION) ×3
IV NS 1000ML BAXH (IV SOLUTION) ×1 IMPLANT
JACKSON PRATT 10 (INSTRUMENTS) IMPLANT
KIT TURNOVER KIT A (KITS) ×3 IMPLANT
LABEL OR SOLS (LABEL) ×3 IMPLANT
NEEDLE HYPO 22GX1.5 SAFETY (NEEDLE) ×3 IMPLANT
NEEDLE VERESS 14GA 120MM (NEEDLE) ×3 IMPLANT
NS IRRIG 500ML POUR BTL (IV SOLUTION) ×3 IMPLANT
PACK LAP CHOLECYSTECTOMY (MISCELLANEOUS) ×3 IMPLANT
POUCH SPECIMEN RETRIEVAL 10MM (ENDOMECHANICALS) ×3 IMPLANT
SCISSORS METZENBAUM CVD 33 (INSTRUMENTS) ×3 IMPLANT
SLEEVE ENDOPATH XCEL 5M (ENDOMECHANICALS) ×6 IMPLANT
SPONGE GAUZE 2X2 8PLY STER LF (GAUZE/BANDAGES/DRESSINGS) ×1
SPONGE GAUZE 2X2 8PLY STRL LF (GAUZE/BANDAGES/DRESSINGS) ×5 IMPLANT
SPONGE LAP 18X18 5 PK (GAUZE/BANDAGES/DRESSINGS) ×3 IMPLANT
SPONGE VERSALON 4X4 4PLY (MISCELLANEOUS) IMPLANT
STRIP CLOSURE SKIN 1/2X4 (GAUZE/BANDAGES/DRESSINGS) ×2 IMPLANT
SUT MNCRL 4-0 (SUTURE) ×3
SUT MNCRL 4-0 27XMFL (SUTURE) ×1
SUT VICRYL 0 AB UR-6 (SUTURE) ×3 IMPLANT
SUTURE MNCRL 4-0 27XMF (SUTURE) ×1 IMPLANT
SYR 20CC LL (SYRINGE) ×3 IMPLANT
TROCAR XCEL NON-BLD 11X100MML (ENDOMECHANICALS) ×3 IMPLANT
TROCAR XCEL NON-BLD 5MMX100MML (ENDOMECHANICALS) ×3 IMPLANT
TUBING INSUFFLATION (TUBING) ×3 IMPLANT

## 2017-05-30 NOTE — OR Nursing (Signed)
Discharge instructions discussed with pt and wife. Both voice understanding. 

## 2017-05-30 NOTE — Transfer of Care (Signed)
Immediate Anesthesia Transfer of Care Note  Patient: Warren David  Procedure(s) Performed: LAPAROSCOPIC CHOLECYSTECTOMY WITH INTRAOPERATIVE CHOLANGIOGRAM (N/A Abdomen)  Patient Location: PACU  Anesthesia Type:General  Level of Consciousness: awake, alert , oriented and patient cooperative  Airway & Oxygen Therapy: Patient Spontanous Breathing and Patient connected to face mask oxygen  Post-op Assessment: Report given to RN, Post -op Vital signs reviewed and stable and Patient moving all extremities  Post vital signs: Reviewed and stable  Last Vitals:  Vitals Value Taken Time  BP 131/72 05/30/2017  4:07 PM  Temp 36.2 C 05/30/2017  4:07 PM  Pulse 100 05/30/2017  4:09 PM  Resp 18 05/30/2017  4:09 PM  SpO2 100 % 05/30/2017  4:09 PM  Vitals shown include unvalidated device data.  Last Pain:  Vitals:   05/30/17 1356  TempSrc: Oral  PainSc: 0-No pain         Complications: No apparent anesthesia complications

## 2017-05-30 NOTE — Anesthesia Postprocedure Evaluation (Signed)
Anesthesia Post Note  Patient: Warren David IV  Procedure(s) Performed: LAPAROSCOPIC CHOLECYSTECTOMY WITH INTRAOPERATIVE CHOLANGIOGRAM (N/A Abdomen)  Patient location during evaluation: PACU Anesthesia Type: General Level of consciousness: awake and alert and oriented Pain management: pain level controlled Vital Signs Assessment: post-procedure vital signs reviewed and stable Respiratory status: spontaneous breathing Cardiovascular status: blood pressure returned to baseline Anesthetic complications: no     Last Vitals:  Vitals:   05/30/17 1715 05/30/17 1732  BP: 131/76 124/79  Pulse: 88   Resp: 16   Temp: 36.5 C   SpO2: 100% 100%    Last Pain:  Vitals:   05/30/17 1715  TempSrc:   PainSc: 4                  Nester Bachus

## 2017-05-30 NOTE — Anesthesia Post-op Follow-up Note (Signed)
Anesthesia QCDR form completed.        

## 2017-05-30 NOTE — Anesthesia Preprocedure Evaluation (Signed)
Anesthesia Evaluation  Patient identified by MRN, date of birth, ID band Patient awake    Reviewed: Allergy & Precautions, NPO status , Patient's Chart, lab work & pertinent test results, reviewed documented beta blocker date and time   Airway Mallampati: II  TM Distance: >3 FB     Dental  (+) Chipped   Pulmonary asthma ,           Cardiovascular      Neuro/Psych  Headaches,  Neuromuscular disease    GI/Hepatic hiatal hernia, GERD  Controlled,  Endo/Other    Renal/GU      Musculoskeletal   Abdominal   Peds  Hematology   Anesthesia Other Findings   Reproductive/Obstetrics                             Anesthesia Physical Anesthesia Plan  ASA: II  Anesthesia Plan: General   Post-op Pain Management:    Induction: Intravenous  PONV Risk Score and Plan:   Airway Management Planned: Oral ETT  Additional Equipment:   Intra-op Plan:   Post-operative Plan:   Informed Consent: I have reviewed the patients History and Physical, chart, labs and discussed the procedure including the risks, benefits and alternatives for the proposed anesthesia with the patient or authorized representative who has indicated his/her understanding and acceptance.     Plan Discussed with: CRNA  Anesthesia Plan Comments:         Anesthesia Quick Evaluation

## 2017-05-30 NOTE — Progress Notes (Signed)
Preoperative Review   Patient is met in the preoperative holding area. The history is reviewed in the chart and with the patient. I personally reviewed the options and rationale as well as the risks of this procedure that have been previously discussed with the patient. All questions asked by the patient and/or family were answered to their satisfaction. Discussed cholangiography and the potential for retained stone as his LFTs are slightly elevated.  We discussed these options and he understood and agreed with this plan. Patient agrees to proceed with this procedure at this time.  Florene Glen M.D. FACS

## 2017-05-30 NOTE — Op Note (Signed)
Laparoscopic Cholecystectomy  Pre-operative Diagnosis: Biliary pancreatitis  Post-operative Diagnosis: Same  Procedure: Lap scopic cholecystectomy with C-arm fluoroscopic cholangiography  Surgeon: Adah Salvage. Excell Seltzer, MD FACS  Anesthesia: Gen. with endotracheal tube  Assistant: Surgical tech  Procedure Details  The patient was seen again in the Holding Room. The benefits, complications, treatment options, and expected outcomes were discussed with the patient. The risks of bleeding, infection, recurrence of symptoms, failure to resolve symptoms, bile duct damage, bile duct leak, retained common bile duct stone, bowel injury, any of which could require further surgery and/or ERCP, stent, or papillotomy were reviewed with the patient. The likelihood of improving the patient's symptoms with return to their baseline status is good.  The patient and/or family concurred with the proposed plan, giving informed consent.  The patient was taken to Operating Room, identified as Warren David and the procedure verified as Laparoscopic Cholecystectomy.  A Time Out was held and the above information confirmed.  Prior to the induction of general anesthesia, antibiotic prophylaxis was administered. VTE prophylaxis was in place. General endotracheal anesthesia was then administered and tolerated well. After the induction, the abdomen was prepped with Chloraprep and draped in the sterile fashion. The patient was positioned in the supine position.  Local anesthetic  was injected into the skin near the umbilicus and an incision made. The Veress needle was placed. Pneumoperitoneum was then created with CO2 and tolerated well without any adverse changes in the patient's vital signs. A 5mm port was placed in the periumbilical position and the abdominal cavity was explored.  The known umbilical hernia was avoided.  Two 5-mm ports were placed in the right upper quadrant and a 12 mm epigastric port was placed all  under direct vision. All skin incisions  were infiltrated with a local anesthetic agent before making the incision and placing the trocars.   The patient was positioned  in reverse Trendelenburg, tilted slightly to the patient's left.  The gallbladder was identified, the fundus grasped and retracted cephalad. Adhesions were lysed bluntly.  The gallbladder was found to be full of large stones and contracted and scar fired.  The infundibulum was grasped and retracted laterally, exposing the peritoneum overlying the triangle of Calot. This was then divided and exposed in a blunt fashion. A critical view of the cystic duct and cystic artery was obtained.  The cystic duct was clearly identified and bluntly dissected.   It was clipped and incised and through a separate incision and Angiocath a cholangiogram catheter was placed.  C-arm fluoroscopic angiography demonstrated good flow of the duodenum without intraluminal filling defects.  Proximal ducts were well identified the cystic duct had been cannulated.  Following this the cystic duct catheter was removed the cystic duct was doubly clipped and divided.  Venous branches and 2 branches of the cystic artery were doubly clipped and divided.  The gallbladder was taken from the gallbladder fossa in a retrograde fashion with the electrocautery.  The gallbladder was quite scar fired.  The gallbladder was removed and placed in an Endocatch bag. The liver bed was irrigated and inspected. Hemostasis was achieved with the electrocautery. Copious irrigation was utilized and was repeatedly aspirated until clear.  The gallbladder and Endocatch sac were then removed through the epigastric port site.   Inspection of the right upper quadrant was performed. No bleeding, bile duct injury or leak, or bowel injury was noted. Pneumoperitoneum was released.  The epigastric port site was closed with figure-of-eight 0 Vicryl sutures. 4-0 subcuticular  Monocryl was used to close the  skin. Steristrips and Mastisol and sterile dressings were  applied.  The patient was then extubated and brought to the recovery room in stable condition. Sponge, lap, and needle counts were correct at closure and at the conclusion of the case.   Findings: Chronic cholecystitis with normal cholangiogram.  Estimated Blood Loss: 25 cc         Drains: *None         Specimens: Gallbladder           Complications: none               [name] E. Excell Seltzerooper, MD, FACS

## 2017-05-30 NOTE — Discharge Instructions (Signed)
Remove dressing in 24 hours. °May shower in 24 hours. °Leave paper strips in place. °Resume all home medications. °Follow-up with Dr. Cooper in 10 days. ° °AMBULATORY SURGERY  °DISCHARGE INSTRUCTIONS ° ° °1) The drugs that you were given will stay in your system until tomorrow so for the next 24 hours you should not: ° °A) Drive an automobile °B) Make any legal decisions °C) Drink any alcoholic beverage ° ° °2) You may resume regular meals tomorrow.  Today it is better to start with liquids and gradually work up to solid foods. ° °You may eat anything you prefer, but it is better to start with liquids, then soup and crackers, and gradually work up to solid foods. ° ° °3) Please notify your doctor immediately if you have any unusual bleeding, trouble breathing, redness and pain at the surgery site, drainage, fever, or pain not relieved by medication. ° ° ° °4) Additional Instructions: ° ° ° ° ° ° ° °Please contact your physician with any problems or Same Day Surgery at [phone], Monday through Friday 6 am to 4 pm, or [place] [facility] [place] [facility] number at [phone]. °

## 2017-05-30 NOTE — Anesthesia Procedure Notes (Signed)
Procedure Name: Intubation Date/Time: 05/30/2017 3:17 PM Performed by: Omer JackWeatherly, Aleisha Paone, CRNA Pre-anesthesia Checklist: Patient identified, Patient being monitored, Timeout performed, Emergency Drugs available and Suction available Patient Re-evaluated:Patient Re-evaluated prior to induction Oxygen Delivery Method: Circle system utilized Preoxygenation: Pre-oxygenation with 100% oxygen Induction Type: IV induction Ventilation: Mask ventilation without difficulty Laryngoscope Size: Miller and 2 Grade View: Grade I Tube type: Oral Tube size: 7.5 mm Number of attempts: 1 Airway Equipment and Method: Stylet Placement Confirmation: ETT inserted through vocal cords under direct vision,  positive ETCO2 and breath sounds checked- equal and bilateral Secured at: 21 cm Tube secured with: Tape Dental Injury: Teeth and Oropharynx as per pre-operative assessment

## 2017-05-31 ENCOUNTER — Encounter: Payer: Self-pay | Admitting: Surgery

## 2017-06-02 LAB — SURGICAL PATHOLOGY

## 2017-06-12 ENCOUNTER — Encounter: Payer: Self-pay | Admitting: Surgery

## 2017-06-12 ENCOUNTER — Ambulatory Visit (INDEPENDENT_AMBULATORY_CARE_PROVIDER_SITE_OTHER): Payer: Self-pay | Admitting: Surgery

## 2017-06-12 VITALS — BP 127/88 | HR 106 | Temp 97.8°F | Wt 148.0 lb

## 2017-06-12 DIAGNOSIS — K8511 Biliary acute pancreatitis with uninfected necrosis: Secondary | ICD-10-CM

## 2017-06-12 NOTE — Patient Instructions (Signed)
GENERAL POST-OPERATIVE PATIENT INSTRUCTIONS   WOUND CARE INSTRUCTIONS:  Keep a dry clean dressing on the wound if there is drainage. The initial bandage may be removed after 24 hours.  Once the wound has quit draining you may leave it open to air.  If clothing rubs against the wound or causes irritation and the wound is not draining you may cover it with a dry dressing during the daytime.  Try to keep the wound dry and avoid ointments on the wound unless directed to do so.  If the wound becomes bright red and painful or starts to drain infected material that is not clear, please contact your physician immediately.  If the wound is mildly pink and has a thick firm ridge underneath it, this is normal, and is referred to as a healing ridge.  This will resolve over the next 4-6 weeks.  BATHING: You may shower if you have been informed of this by your surgeon. However, Please do not submerge in a tub, hot tub, or pool until incisions are completely sealed or have been told by your surgeon that you may do so.  DIET:  You may eat any foods that you can tolerate.  It is a good idea to eat a high fiber diet and take in plenty of fluids to prevent constipation.  If you do become constipated you may want to take a mild laxative or take ducolax tablets on a daily basis until your bowel habits are regular.  Constipation can be very uncomfortable, along with straining, after recent surgery.  ACTIVITY:  You are encouraged to cough and deep breath or use your incentive spirometer if you were given one, every 15-30 minutes when awake.  This will help prevent respiratory complications and low grade fevers post-operatively if you had a general anesthetic.  You may want to hug a pillow when coughing and sneezing to add additional support to the surgical area, if you had abdominal or chest surgery, which will decrease pain during these times.  You are encouraged to walk and engage in light activity for the next two weeks.  You  should not lift more than 20 pounds, until 06/29/2017 as it could put you at increased risk for complications.  Twenty pounds is roughly equivalent to a plastic bag of groceries. At that time- Listen to your body when lifting, if you have pain when lifting, stop and then try again in a few days. Soreness after doing exercises or activities of daily living is normal as you get back in to your normal routine.  MEDICATIONS:  Try to take narcotic medications and anti-inflammatory medications, such as tylenol, ibuprofen, naprosyn, etc., with food.  This will minimize stomach upset from the medication.  Should you develop nausea and vomiting from the pain medication, or develop a rash, please discontinue the medication and contact your physician.  You should not drive, make important decisions, or operate machinery when taking narcotic pain medication.  SUNBLOCK Use sun block to incision area over the next year if this area will be exposed to sun. This helps decrease scarring and will allow you avoid a permanent darkened area over your incision.  QUESTIONS:  Please feel free to call our office if you have any questions, and we will be glad to assist you. [phone]    

## 2017-06-12 NOTE — Progress Notes (Signed)
Outpatient postop visit  06/12/2017  Warren David IV is an 47 y.o. male.    Procedure: Laparoscopic cholecystectomy with cholangiography for history of biliary pancreatitis  CC: Minimal pain  HPI: This patient with a long Compcare history of biliary pancreatitis who underwent an elective laparoscopic cholecystectomy with cholangiography.  Patient is eating well tolerating a diet having no problems at this time with the exception of minimal incisional pain.  Jaundice no acholic stools no back pain  Medications reviewed.    Physical Exam:  There were no vitals taken for this visit.    PE: Wounds are clean no erythema no drainage no icterus no jaundice nontender abdomen    Assessment/Plan:  Patient doing very well pathology is reviewed patient will follow-up on an as-needed basis  Lattie Haw, MD, FACS

## 2018-05-10 ENCOUNTER — Telehealth: Payer: Self-pay

## 2018-05-10 NOTE — Telephone Encounter (Signed)
Received a fax from [facility] asking for a refill of Proair. He has not been in the office for over a year and I do not see a diagnosis for the rx.

## 2018-05-11 MED ORDER — ALBUTEROL SULFATE HFA 108 (90 BASE) MCG/ACT IN AERS: 2.0000 | INHALATION_SPRAY | Freq: Four times a day (QID) | RESPIRATORY_TRACT | 1 refills | Status: DC | PRN

## 2018-05-11 NOTE — Telephone Encounter (Signed)
Okay to refill #1 x 1 No rush on visit under the circumstances

## 2018-05-11 NOTE — Telephone Encounter (Signed)
Rx sent electronically.  

## 2018-09-03 ENCOUNTER — Other Ambulatory Visit: Payer: Self-pay

## 2018-09-03 ENCOUNTER — Ambulatory Visit (INDEPENDENT_AMBULATORY_CARE_PROVIDER_SITE_OTHER): Payer: 59 | Admitting: Internal Medicine

## 2018-09-03 ENCOUNTER — Encounter: Payer: Self-pay | Admitting: Internal Medicine

## 2018-09-03 DIAGNOSIS — L03211 Cellulitis of face: Secondary | ICD-10-CM | POA: Insufficient documentation

## 2018-09-03 MED ORDER — AMOXICILLIN-POT CLAVULANATE 875-125 MG PO TABS: 1.0000 | ORAL_TABLET | Freq: Two times a day (BID) | ORAL | 1 refills | Status: DC

## 2018-09-03 MED ORDER — CEFTRIAXONE SODIUM 1 G IJ SOLR
1.0000 g | Freq: Once | INTRAMUSCULAR | Status: AC
  Administered 2018-09-03: 1 g via INTRAMUSCULAR

## 2018-09-03 NOTE — Assessment & Plan Note (Signed)
Fairly extensive and on face---will give rocephin, then augmentin Doubt MRSA---would change to doxy if doesn't respond

## 2018-09-03 NOTE — Addendum Note (Signed)
Addended by: Pilar Grammes on: 09/03/2018 04:00 PM   Modules accepted: Orders

## 2018-09-03 NOTE — Progress Notes (Signed)
Subjective:    Patient ID: Warren David, male    DOB: [birth year], 48 y.o.   MRN: [id number]  HPI Here due to concerns about a skin area on face  The mask has irritated his face Tried hot and cold compresses Mupirocin didn't help Face actually got really swollen--and red Took some left over prednisione--- 30mg  yesterday and today  Swelling is better Wonders if he needs antibiotic Still red with drainage  Has known past cystic boils ---might be this again  Current Outpatient Medications on File Prior to Visit  Medication Sig Dispense Refill  . albuterol (PROVENTIL HFA;VENTOLIN HFA) 108 (90 Base) MCG/ACT inhaler Inhale 2 puffs into the lungs every 6 (six) hours as needed for wheezing or shortness of breath. 1 Inhaler 1  . fluticasone (FLONASE) 50 MCG/ACT nasal spray Place 1 spray into both nostrils daily as needed for allergies.     Marland Kitchen ibuprofen (ADVIL,MOTRIN) 200 MG tablet Take 400 mg by mouth daily as needed.    . loratadine (CLARITIN) 10 MG tablet Take 10 mg by mouth daily as needed for allergies.     . Melatonin 3 MG TABS Take 3 mg by mouth at bedtime as needed (sleep).    . Multiple Vitamin (MULTIVITAMIN WITH MINERALS) TABS tablet Take 1 tablet by mouth 3 (three) times a week.    Marland Kitchen omeprazole (PRILOSEC) 40 MG capsule Take 40 mg by mouth every evening.     Marland Kitchen oxymetazoline (AFRIN) 0.05 % nasal spray Place 1 spray into both nostrils daily as needed (nosebleeds).     No current facility-administered medications on file prior to visit.     Allergies  Allergen Reactions  . Fluticasone-Salmeterol Palpitations    Past Medical History:  Diagnosis Date  . Acute pancreatitis 03/28/2017  . Asthma    EXERCISE OR ALLERGY INDUCED-WELL CONTROLLED  . Choledocholithiasis with acute cholecystitis   . GERD (gastroesophageal reflux disease)   . Headache   . Hiatal hernia   . 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 CHOLECYSTECTOMY N/A 05/30/2017   Procedure: LAPAROSCOPIC CHOLECYSTECTOMY WITH INTRAOPERATIVE CHOLANGIOGRAM;  Surgeon: Florene Glen, MD;  Location: [facility] OR[facility];  Service: General;  Laterality: N/A;  . ESOPHAGOGASTRODUODENOSCOPY (EGD) WITH PROPOFOL N/A 03/01/2016   Procedure: ESOPHAGOGASTRODUODENOSCOPY (EGD) WITH PROPOFOL;  Surgeon: Lucilla Lame, MD;  Location: [facility]COPY;  Service: Endoscopy;  Laterality: N/A;  . HYDROCELE EXCISION    . INGUINAL HERNIA REPAIR    . TONSILLECTOMY     AGE 34    Family History  Problem Relation Age of Onset  . Alzheimer's disease Mother   . Colon cancer Paternal Aunt   . Throat cancer Paternal Grandfather     Social History   Socioeconomic History  . Marital status: Married    Spouse name: Not on file  . Number of children: Not on file  . Years of education: Not on file  . Highest education level: Not on file  Occupational History  . Not on file  Social Needs  . Financial resource strain: Not on file  . Food insecurity    Worry: Not on file    Inability: Not on file  . Transportation needs    Medical: Not on file    Non-medical: Not on file  Tobacco Use  . Smoking status: Never Smoker  . Smokeless tobacco: Never Used  Substance and  Sexual Activity  . Alcohol use: Yes    Alcohol/week: 0.0 standard drinks    Comment: OCC/SOCIAL  . Drug use: No  . Sexual activity: Not on file  Lifestyle  . Physical activity    Days per week: Not on file    Minutes per session: Not on file  . Stress: Not on file  Relationships  . Social Musicianconnections    Talks on phone: Not on file    Gets together: Not on file    Attends religious service: Not on file    Active member of club or organization: Not on file    Attends meetings of clubs or organizations: Not on file    Relationship status: Not on file  . Intimate partner violence    Fear of current or ex partner: Not on file    Emotionally abused: Not on  file    Physically abused: Not on file    Forced sexual activity: Not on file  Other Topics Concern  . Not on file  Social History Narrative  . Not on file   Review of Systems No fever No N/V Eating okay    Objective:   Physical Exam  Constitutional: He appears well-developed. No distress.  Skin:  Dark area on upper left cheek with surrounding warmth, redness and heat-----~7cm across cheek and to just under orbital area and to ear           Assessment & Plan:

## 2019-02-13 ENCOUNTER — Emergency Department
Admission: EM | Admit: 2019-02-13 | Discharge: 2019-02-14 | Disposition: A | Discharge status: Home / Self Care | Payer: No Typology Code available for payment source | Source: Home / Self Care | Attending: Emergency Medicine | Admitting: Emergency Medicine

## 2019-02-13 ENCOUNTER — Emergency Department: Payer: No Typology Code available for payment source

## 2019-02-13 ENCOUNTER — Other Ambulatory Visit: Payer: Self-pay

## 2019-02-13 DIAGNOSIS — N201 Calculus of ureter: Secondary | ICD-10-CM | POA: Diagnosis not present

## 2019-02-13 DIAGNOSIS — R109 Unspecified abdominal pain: Secondary | ICD-10-CM | POA: Insufficient documentation

## 2019-02-13 DIAGNOSIS — J45909 Unspecified asthma, uncomplicated: Secondary | ICD-10-CM | POA: Diagnosis not present

## 2019-02-13 DIAGNOSIS — R110 Nausea: Secondary | ICD-10-CM | POA: Insufficient documentation

## 2019-02-13 DIAGNOSIS — Z79899 Other long term (current) drug therapy: Secondary | ICD-10-CM | POA: Insufficient documentation

## 2019-02-13 DIAGNOSIS — K219 Gastro-esophageal reflux disease without esophagitis: Secondary | ICD-10-CM | POA: Diagnosis not present

## 2019-02-13 LAB — LIPASE, BLOOD: Lipase: 36 U/L (ref 11–51)

## 2019-02-13 LAB — COMPREHENSIVE METABOLIC PANEL
ALT: 48 U/L — ABNORMAL HIGH (ref 0–44)
AST: 34 U/L (ref 15–41)
Albumin: 4.8 g/dL (ref 3.5–5.0)
Alkaline Phosphatase: 103 U/L (ref 38–126)
Anion gap: 16 — ABNORMAL HIGH (ref 5–15)
BUN: 17 mg/dL (ref 6–20)
CO2: 25 mmol/L (ref 22–32)
Calcium: 9.6 mg/dL (ref 8.9–10.3)
Chloride: 101 mmol/L (ref 98–111)
Creatinine, Ser: 1.27 mg/dL — ABNORMAL HIGH (ref 0.61–1.24)
GFR calc Af Amer: >60 mL/min (ref >60)
GFR calc non Af Amer: >60 mL/min (ref >60)
Glucose, Bld: 133 mg/dL — ABNORMAL HIGH (ref 70–99)
Potassium: 3.5 mmol/L (ref 3.5–5.1)
Sodium: 142 mmol/L (ref 135–145)
Total Bilirubin: 1 mg/dL (ref 0.3–1.2)
Total Protein: 8.1 g/dL (ref 6.5–8.1)

## 2019-02-13 LAB — URINALYSIS, COMPLETE (UACMP) WITH MICROSCOPIC
Bacteria, UA: NONE SEEN
Bilirubin Urine: NEGATIVE
Glucose, UA: NEGATIVE mg/dL
Ketones, ur: 5 mg/dL — AB
Leukocytes,Ua: NEGATIVE
Nitrite: NEGATIVE
Protein, ur: 30 mg/dL — AB
RBC / HPF: >50 RBC/hpf — ABNORMAL HIGH (ref 0–5)
Specific Gravity, Urine: 1.025 (ref 1.005–1.030)
Squamous Epithelial / HPF: NONE SEEN (ref 0–5)
pH: 7 (ref 5.0–8.0)

## 2019-02-13 LAB — CBC
HCT: 47.3 % (ref 39.0–52.0)
Hemoglobin: 15.9 g/dL (ref 13.0–17.0)
MCH: 30.9 pg (ref 26.0–34.0)
MCHC: 33.6 g/dL (ref 30.0–36.0)
MCV: 91.8 fL (ref 80.0–100.0)
Platelets: 229 10*3/uL (ref 150–400)
RBC: 5.15 MIL/uL (ref 4.22–5.81)
RDW: 11.6 % (ref 11.5–15.5)
WBC: 9.5 10*3/uL (ref 4.0–10.5)
nRBC: 0 % (ref 0.0–0.2)

## 2019-02-13 MED ORDER — ONDANSETRON 4 MG PO TBDP
4.0000 mg | ORAL_TABLET | Freq: Once | ORAL | Status: AC
  Administered 2019-02-13: 22:00:00 4 mg via ORAL
  Filled 2019-02-13: qty 1

## 2019-02-13 MED ORDER — OXYCODONE-ACETAMINOPHEN 5-325 MG PO TABS
1.0000 | ORAL_TABLET | Freq: Once | ORAL | Status: AC
  Administered 2019-02-13: 1 via ORAL
  Filled 2019-02-13: qty 1

## 2019-02-13 MED ORDER — SODIUM CHLORIDE 0.9% FLUSH: 3.0000 mL | Freq: Once | INTRAVENOUS | Status: DC

## 2019-02-13 NOTE — ED Notes (Addendum)
Pt to CT via w/c accomp by CT tech 

## 2019-02-13 NOTE — ED Triage Notes (Signed)
Pt to the er for RLQ abd pain. Pt has a hx of pancreatitis. Gallbladder has been removed. Pt still has appendix. Pt has hx of hernia repair. Some nausea but no vomiting. Pain comes in waves and sharp.

## 2019-02-13 NOTE — ED Notes (Signed)
Pt pacing in lobby without difficulty but appears uncomfortable; st increasing pain to rt lower abd, nonradiating; results reviewed and ordered obtained for CT

## 2019-02-14 ENCOUNTER — Ambulatory Visit (INDEPENDENT_AMBULATORY_CARE_PROVIDER_SITE_OTHER): Payer: No Typology Code available for payment source | Admitting: Urology

## 2019-02-14 ENCOUNTER — Encounter
Admission: RE | Disposition: A | Discharge status: Home / Self Care | Payer: Self-pay | Source: Ambulatory Visit | Attending: Urology

## 2019-02-14 ENCOUNTER — Encounter: Payer: Self-pay | Admitting: Urology

## 2019-02-14 ENCOUNTER — Other Ambulatory Visit: Payer: Self-pay | Admitting: Urology

## 2019-02-14 ENCOUNTER — Ambulatory Visit
Admission: RE | Admit: 2019-02-14 | Discharge: 2019-02-14 | Disposition: A | Discharge status: Home / Self Care | Payer: No Typology Code available for payment source | Source: Ambulatory Visit | Attending: Urology | Admitting: Urology

## 2019-02-14 VITALS — BP 149/90 | HR 111 | Ht 68.0 in | Wt 158.0 lb

## 2019-02-14 DIAGNOSIS — N201 Calculus of ureter: Secondary | ICD-10-CM

## 2019-02-14 DIAGNOSIS — J45909 Unspecified asthma, uncomplicated: Secondary | ICD-10-CM | POA: Insufficient documentation

## 2019-02-14 DIAGNOSIS — K219 Gastro-esophageal reflux disease without esophagitis: Secondary | ICD-10-CM | POA: Insufficient documentation

## 2019-02-14 DIAGNOSIS — R110 Nausea: Secondary | ICD-10-CM | POA: Insufficient documentation

## 2019-02-14 DIAGNOSIS — R109 Unspecified abdominal pain: Secondary | ICD-10-CM | POA: Insufficient documentation

## 2019-02-14 DIAGNOSIS — Z79899 Other long term (current) drug therapy: Secondary | ICD-10-CM | POA: Insufficient documentation

## 2019-02-14 HISTORY — PX: EXTRACORPOREAL SHOCK WAVE LITHOTRIPSY: SHX1557

## 2019-02-14 SURGERY — LITHOTRIPSY, ESWL
Anesthesia: Moderate Sedation | Laterality: Right

## 2019-02-14 MED ORDER — ONDANSETRON HCL 4 MG/2ML IJ SOLN: 4.0000 mg | Freq: Once | INTRAMUSCULAR | Status: AC

## 2019-02-14 MED ORDER — ONDANSETRON 4 MG PO TBDP: 4.0000 mg | ORAL_TABLET | Freq: Three times a day (TID) | ORAL | 0 refills | Status: DC | PRN

## 2019-02-14 MED ORDER — DIPHENHYDRAMINE HCL 25 MG PO CAPS: 25.0000 mg | ORAL_CAPSULE | Freq: Once | ORAL | Status: AC

## 2019-02-14 MED ORDER — OXYCODONE-ACETAMINOPHEN 5-325 MG PO TABS
2.0000 | ORAL_TABLET | Freq: Once | ORAL | Status: AC
  Administered 2019-02-14: 2 via ORAL
  Filled 2019-02-14: qty 2

## 2019-02-14 MED ORDER — SODIUM CHLORIDE 0.9 % IV SOLN: INTRAVENOUS | Status: DC

## 2019-02-14 MED ORDER — CIPROFLOXACIN HCL 500 MG PO TABS: 500.0000 mg | ORAL_TABLET | Freq: Once | ORAL | Status: AC

## 2019-02-14 MED ORDER — ONDANSETRON 4 MG PO TBDP
4.0000 mg | ORAL_TABLET | Freq: Once | ORAL | Status: AC
  Administered 2019-02-14: 4 mg via ORAL

## 2019-02-14 MED ORDER — ONDANSETRON HCL 4 MG/2ML IJ SOLN
INTRAMUSCULAR | Status: AC
  Administered 2019-02-14: 10:00:00 4 mg via INTRAVENOUS
  Filled 2019-02-14: qty 2

## 2019-02-14 MED ORDER — TAMSULOSIN HCL 0.4 MG PO CAPS: 0.4000 mg | ORAL_CAPSULE | Freq: Every day | ORAL | 0 refills | Status: DC

## 2019-02-14 MED ORDER — CIPROFLOXACIN HCL 500 MG PO TABS
ORAL_TABLET | ORAL | Status: AC
  Administered 2019-02-14: 10:00:00 500 mg via ORAL
  Filled 2019-02-14: qty 1

## 2019-02-14 MED ORDER — OXYCODONE-ACETAMINOPHEN 5-325 MG PO TABS: 1.0000 | ORAL_TABLET | ORAL | 0 refills | Status: DC | PRN

## 2019-02-14 MED ORDER — DIPHENHYDRAMINE HCL 25 MG PO CAPS
ORAL_CAPSULE | ORAL | Status: AC
  Administered 2019-02-14: 25 mg via ORAL
  Filled 2019-02-14: qty 1

## 2019-02-14 MED ORDER — DIAZEPAM 5 MG PO TABS: 10.0000 mg | ORAL_TABLET | Freq: Once | ORAL | Status: AC

## 2019-02-14 MED ORDER — DIAZEPAM 5 MG PO TABS
ORAL_TABLET | ORAL | Status: AC
  Administered 2019-02-14: 10 mg via ORAL
  Filled 2019-02-14: qty 2

## 2019-02-14 MED ORDER — ONDANSETRON 4 MG PO TBDP
ORAL_TABLET | ORAL | Status: AC
  Filled 2019-02-14: qty 1

## 2019-02-14 NOTE — ED Provider Notes (Signed)
[facility] Emergency Department Provider Note _________________   First MD Initiated Contact with Patient 02/13/19 2354     (approximate)  I have reviewed the triage vital signs and the nursing notes.   HISTORY  Chief Complaint Abdominal Pain    HPI Warren David is a 48 y.o. male with below list of previous medical conditions presents to the emergency department secondary to right flank pain.  Patient states that current pain score is 4 out of 10 and described as throbbing.  Patient states that he has had this discomfort over the past 2 days intermittently however became acutely worsened tonight.  Patient also admits to nausea no vomiting.  Patient denies any fever       Past Medical History:  Diagnosis Date  . Acute pancreatitis 03/28/2017  . Asthma    EXERCISE OR ALLERGY INDUCED-WELL CONTROLLED  . Choledocholithiasis with acute cholecystitis   . GERD (gastroesophageal reflux disease)   . Headache   . Hiatal hernia   . 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 Facial cellulitis 09/03/2018  . Choledocholithiasis with acute cholecystitis   . Acute pancreatitis 03/28/2017  . Gallstone pancreatitis   . 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 ALLERGIC RHINITIS 06/19/2007    Past Surgical History:  Procedure Laterality Date  . CHOLECYSTECTOMY N/A 05/30/2017   Procedure: LAPAROSCOPIC CHOLECYSTECTOMY WITH INTRAOPERATIVE CHOLANGIOGRAM;  Surgeon: Lattie Hawooper, Richard E, MD;  Location: [facility] ORS;  Service: General;  Laterality: N/A;  . ESOPHAGOGASTRODUODENOSCOPY (EGD) WITH PROPOFOL N/A 03/01/2016   Procedure: ESOPHAGOGASTRODUODENOSCOPY (EGD) WITH PROPOFOL;  Surgeon: Midge Miniumarren Wohl, MD;  Location: [facility] ENDOSCOPY;  Service: Endoscopy;  Laterality: N/A;  . HYDROCELE EXCISION    . INGUINAL HERNIA REPAIR    . TONSILLECTOMY     AGE 59    Prior to  Admission medications   Medication Sig Start Date End Date Taking? Authorizing Provider  albuterol (PROVENTIL HFA;VENTOLIN HFA) 108 (90 Base) MCG/ACT inhaler Inhale 2 puffs into the lungs every 6 (six) hours as needed for wheezing or shortness of breath. 05/11/18   Karie SchwalbeLetvak, Richard I, MD  amoxicillin-clavulanate (AUGMENTIN) 875-125 MG tablet Take 1 tablet by mouth 2 (two) times daily. 09/03/18   Karie SchwalbeLetvak, Richard I, MD  fluticasone (FLONASE) 50 MCG/ACT nasal spray Place 1 spray into both nostrils daily as needed for allergies.     [provider]  ibuprofen (ADVIL,MOTRIN) 200 MG tablet Take 400 mg by mouth daily as needed.    [provider]  loratadine (CLARITIN) 10 MG tablet Take 10 mg by mouth daily as needed for allergies.     [provider]  Melatonin 3 MG TABS Take 3 mg by mouth at bedtime as needed (sleep).    [provider]  Multiple Vitamin (MULTIVITAMIN WITH MINERALS) TABS tablet Take 1 tablet by mouth 3 (three) times a week.    [provider]  omeprazole (PRILOSEC) 40 MG capsule Take 40 mg by mouth every evening.     [provider]  oxymetazoline (AFRIN) 0.05 % nasal spray Place 1 spray into both nostrils daily as needed (nosebleeds).    [provider]    Allergies Fluticasone-salmeterol  Family History  Problem Relation Age of Onset  . Alzheimer's disease Mother   . Colon cancer Paternal Aunt   . Throat cancer Paternal Grandfather     Social History  Social History   Tobacco Use  . Smoking status: Never Smoker  . Smokeless tobacco: Never Used  Substance Use Topics  . Alcohol use: Yes    Alcohol/week: 0.0 standard drinks    Comment: OCC/SOCIAL  . Drug use: No    Review of Systems Constitutional: No fever/chills Eyes: No visual changes. ENT: No sore throat. Cardiovascular: Denies chest pain. Respiratory: Denies shortness of breath. Gastrointestinal: Positive for abdominal pain and nausea, no vomiting.   No diarrhea.  No constipation. Genitourinary: Negative for dysuria. Musculoskeletal: Negative for neck pain.  Negative for back pain. Integumentary: Negative for rash. Neurological: Negative for headaches, focal weakness or numbness.   ____________________________________________   PHYSICAL EXAM:  VITAL SIGNS: ED Triage Vitals  Enc Vitals Group     BP 02/13/19 1934 (!) 141/98     Pulse Rate 02/13/19 1934 90     Resp 02/13/19 1934 18     Temp 02/13/19 1934 98.2 F (36.8 C)     Temp Source 02/13/19 1934 Oral     SpO2 02/13/19 1934 100 %     Weight 02/13/19 1935 70.3 kg (155 lb)     Height 02/13/19 1935 1.727 m (5\' 8" )     Head Circumference --      Peak Flow --      Pain Score 02/13/19 1935 5     Pain Loc --      Pain Edu? --      Excl. in [place]? --     Constitutional: Alert and oriented.  Eyes: Conjunctivae are normal.  Head: Atraumatic. Mouth/Throat: Patient is wearing a mask. Neck: No stridor.  No meningeal signs.   Cardiovascular: Normal rate, regular rhythm. Good peripheral circulation. Grossly normal heart sounds. Respiratory: Normal respiratory effort.  No retractions. Gastrointestinal: Soft and nontender. No distention.  Musculoskeletal: No lower extremity tenderness nor edema. No gross deformities of extremities. Neurologic:  Normal speech and language. No gross focal neurologic deficits are appreciated.  Skin:  Skin is warm, dry and intact. Psychiatric: Mood and affect are normal. Speech and behavior are normal.  ____________________________________________   LABS (all labs ordered are listed, but only abnormal results are displayed)  Labs Reviewed  COMPREHENSIVE METABOLIC PANEL - Abnormal; Notable for the following components:      Result Value   Glucose, Bld 133 (*)    Creatinine, Ser 1.27 (*)    ALT 48 (*)    Anion gap 16 (*)    All other components within normal limits  URINALYSIS, COMPLETE (UACMP) WITH MICROSCOPIC - Abnormal; Notable for the  following components:   Color, Urine YELLOW (*)    APPearance HAZY (*)    Hgb urine dipstick SMALL (*)    Ketones, ur 5 (*)    Protein, ur 30 (*)    RBC / HPF >50 (*)    All other components within normal limits  LIPASE, BLOOD  CBC    RADIOLOGY I, [place] N Amylynn Fano, personally viewed and evaluated these images (plain radiographs) as part of my medical decision making, as well as reviewing the written report by the radiologist.  ED MD interpretation: Right-sided obstructive uropathy with a 6 mm proximal right ureteral stone with mild to moderate hydronephrosis.  Official radiology report(s): CT Renal Stone Study  Result Date: 02/13/2019 CLINICAL DATA:  Right lower quadrant pain. EXAM: CT ABDOMEN AND PELVIS WITHOUT CONTRAST TECHNIQUE: Multidetector CT imaging of the abdomen and pelvis was performed following the standard protocol without David contrast. COMPARISON:  May 26, 2017. FINDINGS: Lower chest: The lung bases are clear. The heart size is normal. Hepatobiliary: The liver is normal. Status post cholecystectomy.There is no biliary ductal dilation. Pancreas: Normal contours without ductal dilatation. No peripancreatic fluid collection. Spleen: No splenic laceration or hematoma. Adrenals/Urinary Tract: --Adrenal glands: No adrenal hemorrhage. --Right kidney/ureter: There is mild-to-moderate right-sided hydroureteronephrosis secondary to an obstructing 6 mm stone in the proximal right ureter. There are no significant residual stones in the left kidney. --Left kidney/ureter: No hydronephrosis or perinephric hematoma. --Urinary bladder: Unremarkable. Stomach/Bowel: --Stomach/Duodenum: No hiatal hernia or other gastric abnormality. Normal duodenal course and caliber. --Small bowel: No dilatation or inflammation. --Colon: No focal abnormality. --Appendix: Normal. Vascular/Lymphatic: Atherosclerotic calcification is present within the non-aneurysmal abdominal aorta, without hemodynamically significant  stenosis. --No retroperitoneal lymphadenopathy. --No mesenteric lymphadenopathy. --No pelvic or inguinal lymphadenopathy. Reproductive: The patient is status post prior vasectomy. Other: No ascites or free air. The abdominal wall is normal. Musculoskeletal. No acute displaced fractures. IMPRESSION: Right-sided obstructive uropathy with 6 mm proximal right ureteral stone causing mild-to-moderate hydroureteronephrosis. Aortic Atherosclerosis (ICD10-I70.0). Electronically Signed   By: Katherine Mantle M.D.   On: 02/13/2019 22:23     Procedures   ____________________________________________   INITIAL IMPRESSION / MDM / ASSESSMENT AND PLAN / ED COURSE  As part of my medical decision making, I reviewed the following data within the electronic MEDICAL RECORD NUMBER   48 year old male presented with above-stated history and physical exam secondary to right flank pain.  Differential diagnosis include but not limited to cholelithiasis ureterolithiasis and less likely appendicitis.  CT scan of the abdomen consistent with a 6 mm proximal urethral stone.  Patient was given Percocet in the lobby with improvement of pain.  Given current pain score of 4 out of 10 will repeat Percocet now.  Patient will be referred to urology Dr. Gabrielle Dare   ____________________________________________  FINAL CLINICAL IMPRESSION(S) / ED DIAGNOSES  Final diagnoses:  Ureterolithiasis     MEDICATIONS GIVEN DURING THIS VISIT:  Medications  sodium chloride flush (NS) 0.9 % injection 3 mL (has no administration in time range)  oxyCODONE-acetaminophen (PERCOCET/ROXICET) 5-325 MG per tablet 2 tablet (has no administration in time range)  ondansetron (ZOFRAN-ODT) disintegrating tablet 4 mg (4 mg Oral Given 02/13/19 2150)  oxyCODONE-acetaminophen (PERCOCET/ROXICET) 5-325 MG per tablet 1 tablet (1 tablet Oral Given 02/13/19 2150)     ED Discharge Orders    None      *Please note:  Warren David was evaluated in  Emergency Department on 02/14/2019 for the symptoms described in the history of present illness. He was evaluated in the context of the global COVID-19 pandemic, which necessitated consideration that the patient might be at risk for infection with the SARS-CoV-2 virus that causes COVID-19. Institutional protocols and algorithms that pertain to the evaluation of patients at risk for COVID-19 are in a state of rapid change based on information released by regulatory bodies including the CDC and federal and state organizations. These policies and algorithms were followed during the patient's care in the ED.  Some ED evaluations and interventions may be delayed as a result of limited staffing during the pandemic.*  Note:  This document was prepared using Dragon voice recognition software and may include unintentional dictation errors.   Darci Current, MD 02/14/19 [phone]

## 2019-02-14 NOTE — Brief Op Note (Signed)
Date of procedure: 02/14/19   Preoperative diagnosis:  1. Right 35mm proximal ureteral stone  Postoperative diagnosis:  1. Same   Procedure: 1. Right shockwave lithotripsy  Surgeon: Nickolas Madrid, MD  Anesthesia: General  Complications: None  Intraoperative findings:  1. Dense stone but appeared to smudge and fragment in oblique plane 2. Re-coupled and slowed to 60 shocks/minute for last 2000 shocks  EBL: None  Specimens: None  Disposition: Stable to PACU  Plan: Flomax x 2 weeks NSAIDs and Narcotics PRN for pain control Strain urine RTC 2 weeks for KUB   Nickolas Madrid, MD

## 2019-02-14 NOTE — Discharge Instructions (Signed)

## 2019-02-14 NOTE — Progress Notes (Signed)
02/14/19 8:55 AM   Warren David Mar 27, 1970 [id number]  Referring provider: Venia Carbon, MD [place],  [place] [address]  CC: Right flank pain  HPI: I saw Warren David as an urgent add-on in urology clinic this morning for right flank pain.  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  He had noticed some intermittent right-sided pain over the last week.  CT showed a 7 mm proximal ureteral stone with upstream hydronephrosis,900HU, 12cm SSD.  Urinalysis was noninfected and labs were otherwise benign.  He denies any prior history of nephrolithiasis.  He denies any gross hematuria.  He denies any fevers, chills, chest pain, or shortness of breath.  PMH: Past Medical History:  Diagnosis Date  . Acute pancreatitis 03/28/2017  . Asthma    EXERCISE OR ALLERGY INDUCED-WELL CONTROLLED  . Choledocholithiasis with acute cholecystitis   . GERD (gastroesophageal reflux disease)   . Headache   . Hiatal hernia   . 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 CHOLECYSTECTOMY N/A 05/30/2017   Procedure: LAPAROSCOPIC CHOLECYSTECTOMY WITH INTRAOPERATIVE CHOLANGIOGRAM;  Surgeon: Florene Glen, MD;  Location: [facility] ORS;  Service: General;  Laterality: N/A;  . ESOPHAGOGASTRODUODENOSCOPY (EGD) WITH PROPOFOL N/A 03/01/2016   Procedure: ESOPHAGOGASTRODUODENOSCOPY (EGD) WITH PROPOFOL;  Surgeon: Lucilla Lame, MD;  Location: [facility] ENDOSCOPY;  Service: Endoscopy;  Laterality: N/A;  . HYDROCELE EXCISION    . INGUINAL HERNIA REPAIR    . TONSILLECTOMY     AGE 48    Allergies:  Allergies  Allergen Reactions  . Fluticasone-Salmeterol Palpitations    Family History: Family History  Problem Relation Age of Onset  . Alzheimer's disease Mother   . Colon cancer  Paternal Aunt   . Throat cancer Paternal Grandfather     Social History:  reports that he has never smoked. He has never used smokeless tobacco. He reports current alcohol use. He reports that he does not use drugs.  ROS: Please see flowsheet from today's date for complete review of systems.  Physical Exam: BP (!) 149/90   Pulse (!) 111   Ht 5\' 8"  (1.727 m)   Wt 158 lb (71.7 kg)   BMI 24.02 kg/m    Constitutional:  Alert and oriented, No acute distress. Cardiovascular: Regular rate and rhythm Respiratory: Clear to auscultation bilaterally GI: Abdomen is soft, nontender, nondistended, no abdominal masses GU: Right CVA tenderness Lymph: No cervical or inguinal lymphadenopathy. Skin: No rashes, bruises or suspicious lesions. Neurologic: Grossly intact, no focal deficits, moving all 4 extremities. Psychiatric: Normal mood and affect.  Laboratory Data: Reviewed see HPI  Pertinent Imaging: Reviewed see HPI  Assessment & Plan:   In summary, he is a 48 year old healthy male with no prior history of nephrolithiasis who presents with a 7 mm proximal ureteral stone and ongoing flank pain.  He has no clinical or laboratory signs of infection.  We discussed various treatment options for urolithiasis including observation with or without medical expulsive therapy, shockwave lithotripsy (SWL), ureteroscopy and laser lithotripsy with stent placement, and percutaneous nephrolithotomy.  We discussed that management is based on stone size, location, density, patient co-morbidities, and patient preference.   Stones <61mm in size have a >80% spontaneous passage rate. Data surrounding the use of tamsulosin for  medical expulsive therapy is controversial, but meta analyses suggests it is most efficacious for distal stones between 5-7mm in size. Possible side effects include dizziness/lightheadedness, and retrograde ejaculation.  SWL has a lower stone free rate in a single procedure, but also a  lower complication rate compared to ureteroscopy and avoids a stent and associated stent related symptoms. Possible complications include renal hematoma, steinstrasse, and need for additional treatment.  Ureteroscopy with laser lithotripsy and stent placement has a higher stone free rate than SWL in a single procedure, however increased complication rate including possible infection, ureteral injury, bleeding, and stent related morbidity. Common stent related symptoms include dysuria, urgency/frequency, and flank pain.  After an extensive discussion of the risks and benefits of the above treatment options, the patient would like to proceed with right shockwave lithotripsy today   Sondra Come, MD  [facility] [address], [address] [place], [place] [address] [phone]

## 2019-03-04 ENCOUNTER — Other Ambulatory Visit: Payer: Self-pay | Admitting: Radiology

## 2019-03-04 DIAGNOSIS — N201 Calculus of ureter: Secondary | ICD-10-CM

## 2019-03-06 ENCOUNTER — Ambulatory Visit
Admission: RE | Admit: 2019-03-06 | Discharge: 2019-03-06 | Disposition: A | Discharge status: Home / Self Care | Payer: No Typology Code available for payment source | Source: Ambulatory Visit | Attending: Urology | Admitting: Urology

## 2019-03-06 ENCOUNTER — Other Ambulatory Visit: Payer: Self-pay

## 2019-03-06 ENCOUNTER — Encounter: Payer: Self-pay | Admitting: Urology

## 2019-03-06 ENCOUNTER — Ambulatory Visit (INDEPENDENT_AMBULATORY_CARE_PROVIDER_SITE_OTHER): Payer: No Typology Code available for payment source | Admitting: Urology

## 2019-03-06 VITALS — BP 124/89 | HR 91 | Ht 68.0 in | Wt 156.0 lb

## 2019-03-06 DIAGNOSIS — N201 Calculus of ureter: Secondary | ICD-10-CM | POA: Diagnosis present

## 2019-03-06 DIAGNOSIS — N200 Calculus of kidney: Secondary | ICD-10-CM | POA: Diagnosis not present

## 2019-03-06 NOTE — Progress Notes (Signed)
   03/06/2019 10:55 AM   Warren David 01/18/1971 [id number]  Reason for visit: Follow up right SWL  HPI: I saw [name] back in urology clinic for follow-up after undergoing right shockwave lithotripsy on 02/14/2019 for an 8 mm right proximal ureteral stone.  He did very well after the procedure and passes fragments within 24 hours and his pain completely resolved.  He brought a number of fragments with him today which will be sent for analysis.  He has no prior stone events before this.  Physical Exam: BP 124/89 (BP Location: Left Arm, Patient Position: Sitting, Cuff Size: Normal)   Pulse 91   Ht 5\' 8"  (1.727 m)   Wt 156 lb (70.8 kg)   BMI 23.72 kg/m    KUB today reviewed, no evidence of residual fragments  We discussed general stone prevention strategies including adequate hydration with goal of producing 2.5 L of urine daily, increasing citric acid intake, increasing calcium intake during high oxalate meals, minimizing animal protein, and decreasing salt intake. Information about dietary recommendations given today.   Call with stone analysis results Follow-up as needed  A total of 20 minutes were spent face-to-face with the patient, greater than 50% was spent in patient education, counseling, and coordination of care regarding nephrolithiasis and stone prevention.  , MD  [facility] [address] [place], [place] [phone]

## 2019-03-06 NOTE — Patient Instructions (Signed)
Dietary Guidelines to Help Prevent Kidney Stones Kidney stones are deposits of minerals and salts that form inside your kidneys. Your risk of developing kidney stones may be greater depending on your diet, your lifestyle, the medicines you take, and whether you have certain medical conditions. Most people can reduce their chances of developing kidney stones by following the instructions below. Depending on your overall health and the type of kidney stones you tend to develop, your dietitian may give you more specific instructions. What are tips for following this plan? Reading food labels  Choose foods with "no salt added" or "low-salt" labels. Limit your sodium intake to less than 1500 mg per day.  Choose foods with calcium for each meal and snack. Try to eat about 300 mg of calcium at each meal. Foods that contain 200-500 mg of calcium per serving include: ? 8 oz (237 ml) of milk, fortified nondairy milk, and fortified fruit juice. ? 8 oz (237 ml) of kefir, yogurt, and soy yogurt. ? 4 oz (118 ml) of tofu. ? 1 oz of cheese. ? 1 cup (300 g) of dried figs. ? 1 cup (91 g) of cooked broccoli. ? 1-3 oz can of sardines or mackerel.  Most people need 1000 to 1500 mg of calcium each day. Talk to your dietitian about how much calcium is recommended for you. Shopping  Buy plenty of fresh fruits and vegetables. Most people do not need to avoid fruits and vegetables, even if they contain nutrients that may contribute to kidney stones.  When shopping for convenience foods, choose: ? Whole pieces of fruit. ? Premade salads with dressing on the side. ? Low-fat fruit and yogurt smoothies.  Avoid buying frozen meals or prepared deli foods.  Look for foods with live cultures, such as yogurt and kefir. Cooking  Do not add salt to food when cooking. Place a salt shaker on the table and allow each person to add his or her own salt to taste.  Use vegetable protein, such as beans, textured vegetable  protein (TVP), or tofu instead of meat in pasta, casseroles, and soups. Meal planning   Eat less salt, if told by your dietitian. To do this: ? Avoid eating processed or premade food. ? Avoid eating fast food.  Eat less animal protein, including cheese, meat, poultry, or fish, if told by your dietitian. To do this: ? Limit the number of times you have meat, poultry, fish, or cheese each week. Eat a diet free of meat at least 2 days a week. ? Eat only one serving each day of meat, poultry, fish, or seafood. ? When you prepare animal protein, cut pieces into small portion sizes. For most meat and fish, one serving is about the size of one deck of cards.  Eat at least 5 servings of fresh fruits and vegetables each day. To do this: ? Keep fruits and vegetables on hand for snacks. ? Eat 1 piece of fruit or a handful of berries with breakfast. ? Have a salad and fruit at lunch. ? Have two kinds of vegetables at dinner.  Limit foods that are high in a substance called oxalate. These include: ? Spinach. ? Rhubarb. ? Beets. ? Potato chips and french fries. ? Nuts.  If you regularly take a diuretic medicine, make sure to eat at least 1-2 fruits or vegetables high in potassium each day. These include: ? Avocado. ? Banana. ? Orange, prune, carrot, or tomato juice. ? Baked potato. ? Cabbage. ? Beans and split   peas. General instructions   Drink enough fluid to keep your urine clear or pale yellow. This is the most important thing you can do.  Talk to your health care provider and dietitian about taking daily supplements. Depending on your health and the cause of your kidney stones, you may be advised: ? Not to take supplements with vitamin C. ? To take a calcium supplement. ? To take a daily probiotic supplement. ? To take other supplements such as magnesium, fish oil, or vitamin B6.  Take all medicines and supplements as told by your health care provider.  Limit alcohol intake to no  more than 1 drink a day for nonpregnant women and 2 drinks a day for men. One drink equals 12 oz of beer, 5 oz of wine, or 1 oz of hard liquor.  Lose weight if told by your health care provider. Work with your dietitian to find strategies and an eating plan that works best for you. What foods are not recommended? Limit your intake of the following foods, or as told by your dietitian. Talk to your dietitian about specific foods you should avoid based on the type of kidney stones and your overall health. Grains Breads. Bagels. Rolls. Baked goods. Salted crackers. Cereal. Pasta. Vegetables Spinach. Rhubarb. Beets. Canned vegetables. Pickles. Olives. Meats and other protein foods Nuts. Nut butters. Large portions of meat, poultry, or fish. Salted or cured meats. Deli meats. Hot dogs. Sausages. Dairy Cheese. Beverages Regular soft drinks. Regular vegetable juice. Seasonings and other foods Seasoning blends with salt. Salad dressings. Canned soups. Soy sauce. Ketchup. Barbecue sauce. Canned pasta sauce. Casseroles. Pizza. Lasagna. Frozen meals. Potato chips. French fries. Summary  You can reduce your risk of kidney stones by making changes to your diet.  The most important thing you can do is drink enough fluid. You should drink enough fluid to keep your urine clear or pale yellow.  Ask your health care provider or dietitian how much protein from animal sources you should eat each day, and also how much salt and calcium you should have each day. This information is not intended to replace advice given to you by your health care provider. Make sure you discuss any questions you have with your health care provider. Document Revised: 05/23/2018 Document Reviewed: 01/12/2016 Elsevier Patient Education  2020 Elsevier Inc.  

## 2019-03-08 ENCOUNTER — Telehealth: Payer: Self-pay

## 2019-03-08 ENCOUNTER — Other Ambulatory Visit: Payer: Self-pay | Admitting: Urology

## 2019-03-08 NOTE — Telephone Encounter (Signed)
-----   Message from Sondra Come, MD sent at 03/08/2019  3:53 PM EST -----   ----- Message ----- From: Johnanna Schneiders Sent: 03/08/2019   1:49 PM EST To: Sondra Come, MD

## 2019-03-08 NOTE — Telephone Encounter (Signed)
Stone was calcium oxalate, the most common type of stones. Continue prevention strategies as discussed in clinic  Legrand Rams, MD 03/08/2019   Called pt informed him of the information below. Pt gave verbal understanding.

## 2019-03-08 NOTE — Progress Notes (Signed)
Stone was calcium oxalate, the most common type of stones. Continue prevention strategies as discussed in clinic  Legrand Rams, MD 03/08/2019

## 2019-05-23 ENCOUNTER — Other Ambulatory Visit: Payer: Self-pay | Admitting: Internal Medicine

## 2019-06-10 ENCOUNTER — Other Ambulatory Visit: Payer: Self-pay | Admitting: Internal Medicine

## 2019-07-16 ENCOUNTER — Other Ambulatory Visit: Payer: Self-pay | Admitting: Internal Medicine

## 2019-09-23 ENCOUNTER — Other Ambulatory Visit: Payer: Self-pay

## 2019-09-23 ENCOUNTER — Ambulatory Visit (INDEPENDENT_AMBULATORY_CARE_PROVIDER_SITE_OTHER): Payer: PRIVATE HEALTH INSURANCE | Admitting: Family Medicine

## 2019-09-23 VITALS — BP 122/80 | HR 99 | Temp 97.6°F | Ht 68.0 in | Wt 158.5 lb

## 2019-09-23 DIAGNOSIS — L03211 Cellulitis of face: Secondary | ICD-10-CM

## 2019-09-23 DIAGNOSIS — J3489 Other specified disorders of nose and nasal sinuses: Secondary | ICD-10-CM | POA: Diagnosis not present

## 2019-09-23 MED ORDER — AMOXICILLIN-POT CLAVULANATE 875-125 MG PO TABS: 1.0000 | ORAL_TABLET | Freq: Two times a day (BID) | ORAL | 0 refills | Status: AC

## 2019-09-23 MED ORDER — PREDNISONE 20 MG PO TABS: ORAL_TABLET | ORAL | 0 refills | Status: DC

## 2019-09-23 MED ORDER — CEFTRIAXONE SODIUM 1 G IJ SOLR: 1.0000 g | INTRAMUSCULAR | Status: DC

## 2019-09-23 MED ORDER — CEFTRIAXONE SODIUM 1 G IJ SOLR
1.0000 g | Freq: Once | INTRAMUSCULAR | Status: AC
  Administered 2019-09-23: 1 g via INTRAMUSCULAR

## 2019-09-23 NOTE — Progress Notes (Signed)
Warren David T. Warren David, Warren David, Warren David  [facility] [address] [place], [address]  Phone: [phone]  FAX: [phone]  Warren David - 49 y.o. male  MRN [id number]  Date of Birth: 04-23-1970  Date: 09/23/2019  PCP: Karie Schwalbe, Warren David  Referral: Karie Schwalbe, Warren David  Chief Complaint  Patient presents with  . Facial Swelling    started Augmentin on Saturday morning and 2 days of prednisone    This visit occurred during the SARS-CoV-2 public health emergency.  Safety protocols were in place, including screening questions prior to the visit, additional usage of staff PPE, and extensive cleaning of exam room while observing appropriate contact time as indicated for disinfecting solutions.   Subjective:   Warren David is a 49 y.o. very pleasant male patient with Body mass index is 24.1 kg/m. 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  He does have a history of having a prior nasal infection requiring antibiotics.  At this point he has been on Augmentin for about 2-1/2 days, and he is not had any significant improvement.  He also feels as if he is having some facial swelling fairly near the nose.  He is able to breathe without any difficulty and open and closes eyes normally.  He also has normal movement and has no involvement of the eyelids as well as the lips or tongue.  Facial swelling and started on Augmentin + steroids on Saturday.  5 doses of Augmentin.   Review of Systems is noted in the HPI, as appropriate  Objective:   BP 122/80   Pulse 99   Temp 97.6 F (36.4 C) (Temporal)   Ht 5\' 8"  (1.727 m)   Wt 158 lb 8 oz (71.9 kg)   SpO2 97%   BMI 24.10 kg/m   GEN: No acute distress; alert,appropriate. PULM: Breathing comfortably in no respiratory distress PSYCH: Normally  interactive.   On the nose, almost the entirety of the nose itself is red in appearance, tender to palpation.  There is no fluctuance.  It is mildly warm as well.  No swelling of the lips or eyelids.  Question mild swelling adjacent to the nose.  Laboratory and Imaging Data:  Assessment and Plan:     ICD-10-CM   1. Facial cellulitis  L03.211 cefTRIAXone (ROCEPHIN) injection 1 g    DISCONTINUED: cefTRIAXone (ROCEPHIN) injection 1 g  2. Nose pain  J34.89    Worsening nasal cellulitis.  Continue with Augmentin for 10 days, IM Rocephin today in the office.  Additionally, I am giving the patient some prednisone to take.  He understands that if his symptoms continue to worsen, then he will need further evaluation and in a worse case scenario need to be admitted for David antibiotics.  Follow-up: No follow-ups on file.  Meds ordered this encounter  Medications  . predniSONE (DELTASONE) 20 MG tablet    Sig: 2 tabs po daily for 3 days, then 1 tab po daily for 3 days    Dispense:  9 tablet    Refill:  0  . amoxicillin-clavulanate (AUGMENTIN) 875-125 MG tablet    Sig: Take 1 tablet by mouth 2 (two) times daily for 10 days.    Dispense:  20 tablet    Refill:  0  . DISCONTD: cefTRIAXone (ROCEPHIN)  injection 1 g    Order Specific Question:   Antibiotic Indication:    Answer:   Cellulitis  . cefTRIAXone (ROCEPHIN) injection 1 g    Order Specific Question:   Antibiotic Indication:    Answer:   Cellulitis   Medications Discontinued During This Encounter  Medication Reason  . amoxicillin-clavulanate (AUGMENTIN) 875-125 MG tablet   . cefTRIAXone (ROCEPHIN) injection 1 g    No orders of the defined types were placed in this encounter.   Signed,  Warren Galea. Camelia Stelzner, Warren David   Outpatient Encounter Medications as of 09/23/2019  Medication Sig  . albuterol (VENTOLIN HFA) 108 (90 Base) MCG/ACT inhaler TAKE 2 PUFFS BY MOUTH EVERY 6 HOURS AS NEEDED FOR WHEEZE OR SHORTNESS OF BREATH  .  amoxicillin-clavulanate (AUGMENTIN) 875-125 MG tablet Take 1 tablet by mouth 2 (two) times daily for 10 days.  . fluticasone (FLONASE) 50 MCG/ACT nasal spray Place 1 spray into both nostrils daily as needed for allergies.   Marland Kitchen ibuprofen (ADVIL,MOTRIN) 200 MG tablet Take 400 mg by mouth daily as needed.  . loratadine (CLARITIN) 10 MG tablet Take 10 mg by mouth daily as needed for allergies.   . Melatonin 3 MG TABS Take 3 mg by mouth at bedtime as needed (sleep).  . Multiple Vitamin (MULTIVITAMIN WITH MINERALS) TABS tablet Take 1 tablet by mouth 3 (three) times a week.  Marland Kitchen omeprazole (PRILOSEC) 40 MG capsule Take 40 mg by mouth every evening.   Marland Kitchen oxymetazoline (AFRIN) 0.05 % nasal spray Place 1 spray into both nostrils daily as needed (nosebleeds).  . predniSONE (DELTASONE) 20 MG tablet 2 tabs po daily for 3 days, then 1 tab po daily for 3 days  . [DISCONTINUED] amoxicillin-clavulanate (AUGMENTIN) 875-125 MG tablet Take 1 tablet by mouth 2 (two) times daily.  . [EXPIRED] cefTRIAXone (ROCEPHIN) injection 1 g   . [DISCONTINUED] cefTRIAXone (ROCEPHIN) injection 1 g    No facility-administered encounter medications on file as of 09/23/2019.

## 2019-09-24 ENCOUNTER — Encounter: Payer: Self-pay | Admitting: Family Medicine

## 2020-04-30 IMAGING — CT CT RENAL STONE PROTOCOL
2 of 4 series · 16 of 46 positions shown, 18 images · non-contrast
Comparison: May 26, 2017.

CLINICAL DATA: Right lower quadrant pain.

EXAM:
CT ABDOMEN AND PELVIS WITHOUT CONTRAST
TECHNIQUE: Multidetector CT imaging of the abdomen and pelvis was performed
following the standard protocol without IV contrast.

[Series 2: stone full standard · axial · 0.72mm/px · z∈[-910,-510]mm · 13 of 88 slices shown, 15 images]
[im 4/88  soft-tissue]
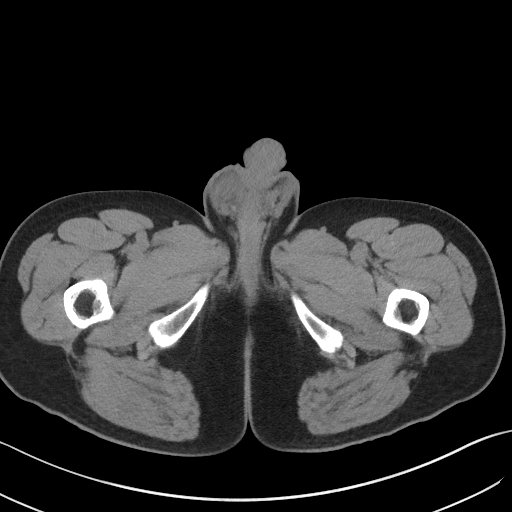
[im 4/88  bone]
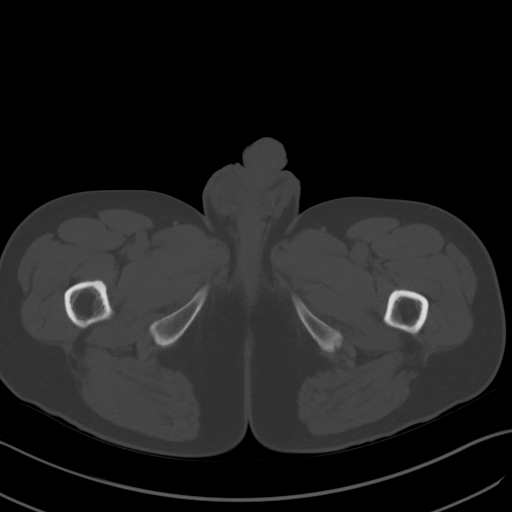
[im 11/88  soft-tissue]
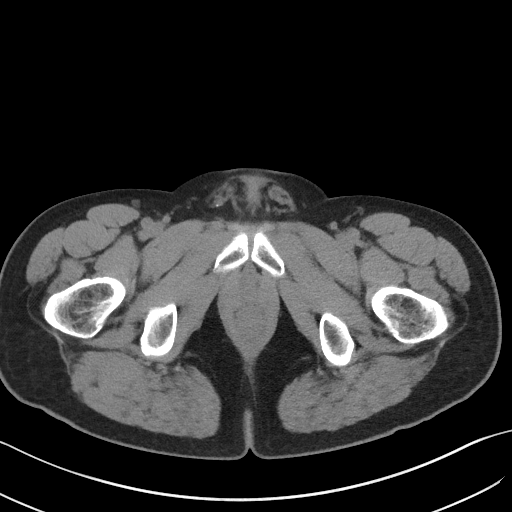
[im 18/88  soft-tissue]
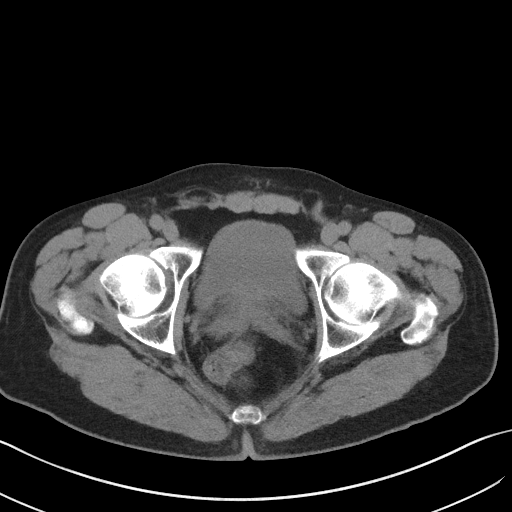
[im 25/88  soft-tissue]
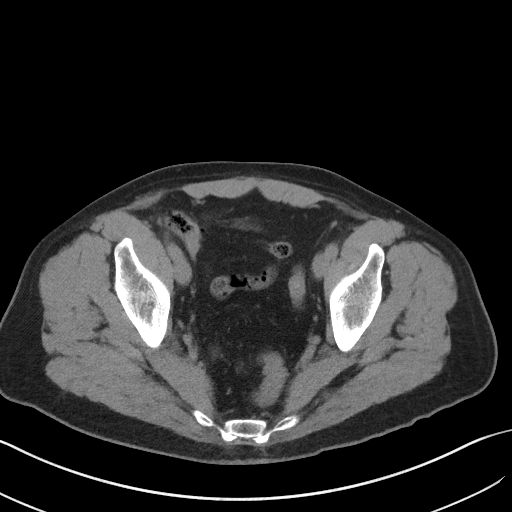
[im 32/88  soft-tissue]
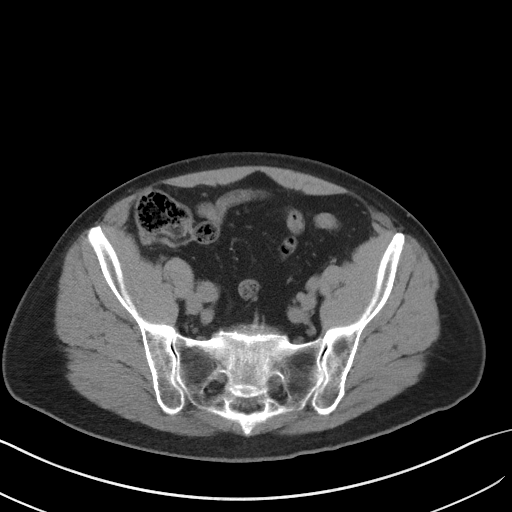
[im 39/88  soft-tissue]
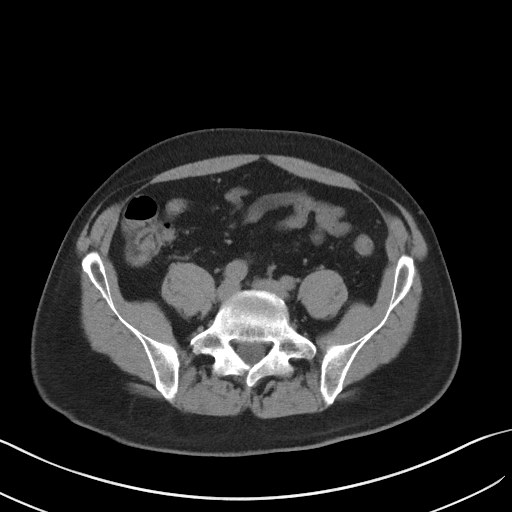
[im 46/88  soft-tissue]
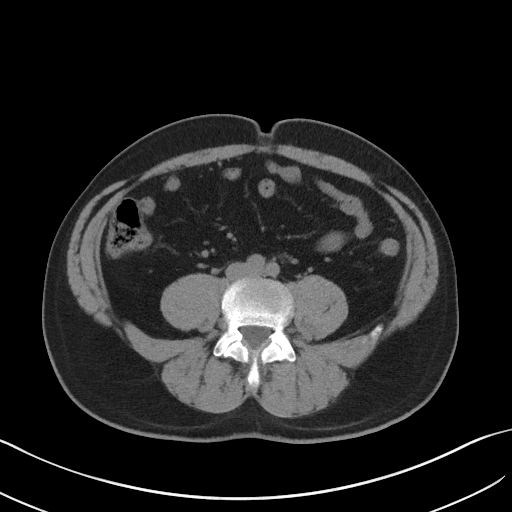
[im 49/88  soft-tissue]
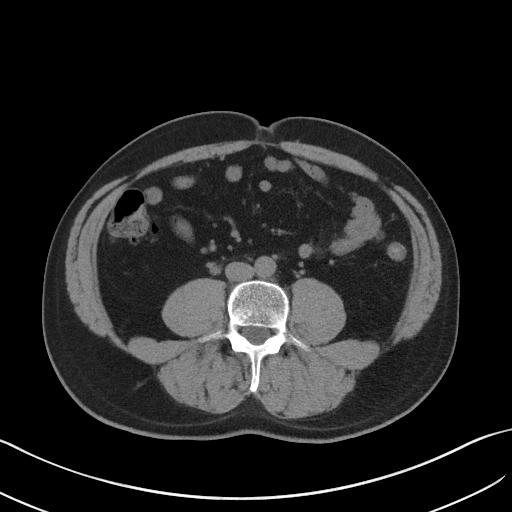
[im 56/88  soft-tissue]
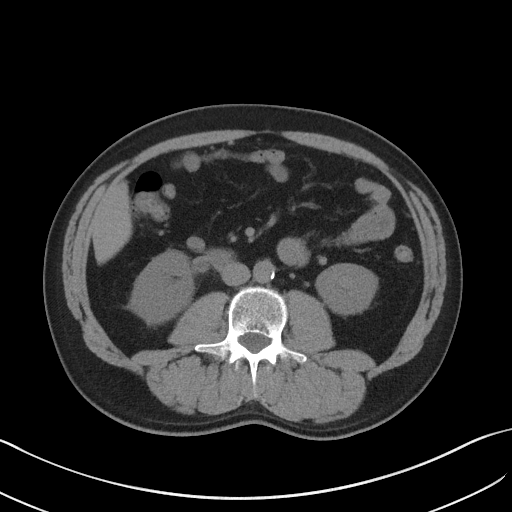
[im 56/88  bone]
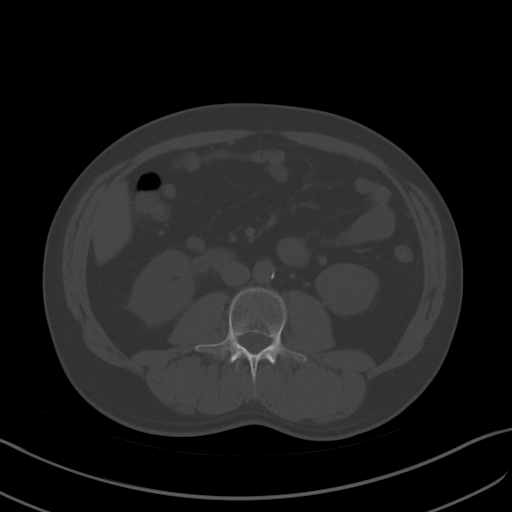
[im 63/88  soft-tissue]
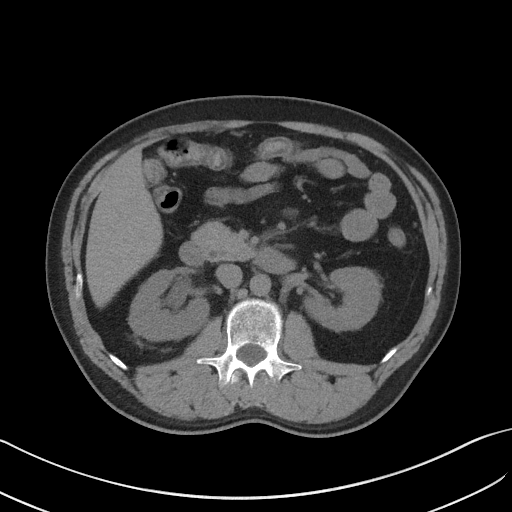
[im 70/88  soft-tissue]
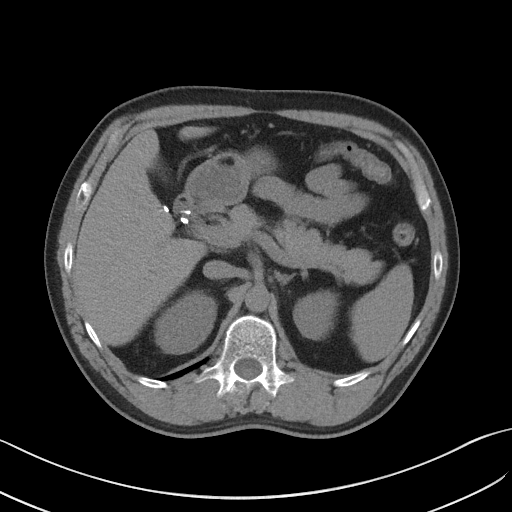
[im 77/88  soft-tissue]
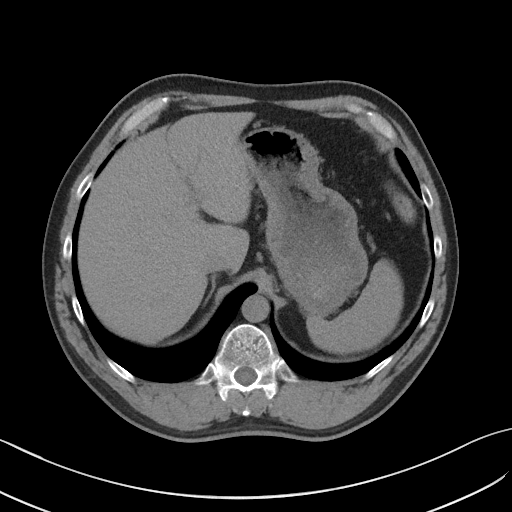
[im 84/88  soft-tissue]
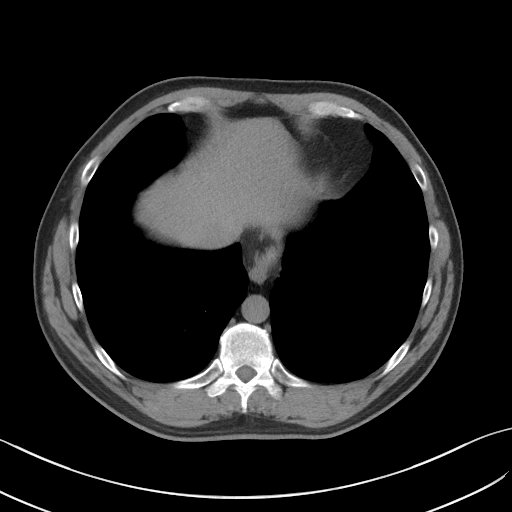

[Series 5: coronal · coronal · 0.74mm/px · 3 of 129 slices shown]
[im 43/129  soft-tissue]
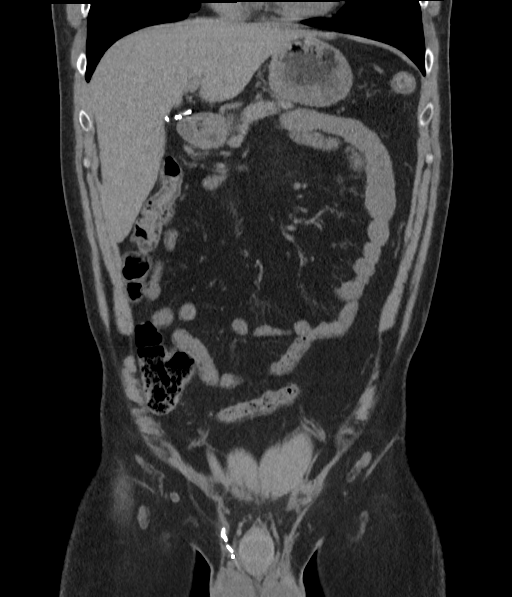
[im 57/129  soft-tissue]
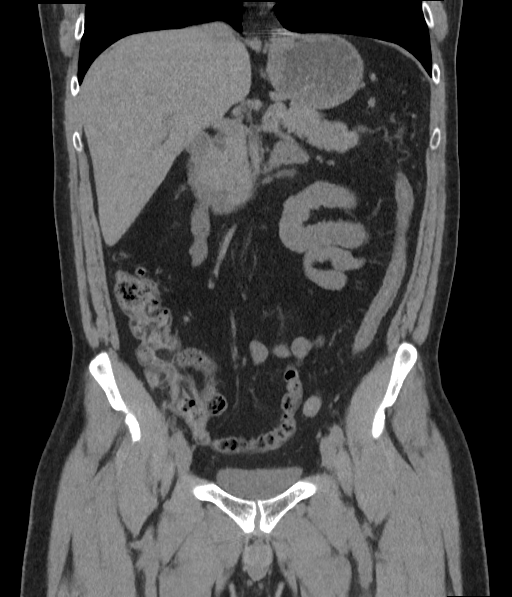
[im 72/129  soft-tissue]
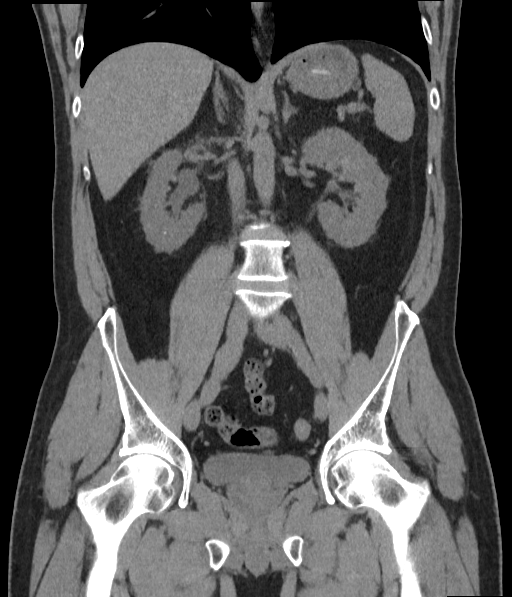

[16 of 46 positions shown; findings below may reference images not displayed]

FINDINGS: Lower chest: The lung bases are clear. The heart size is normal.

Hepatobiliary: The liver is normal. Status post
cholecystectomy.There is no biliary ductal dilation.

Pancreas: Normal contours without ductal dilatation. No
peripancreatic fluid collection.

Spleen: No splenic laceration or hematoma.

Adrenals/Urinary Tract:

--Adrenal glands: No adrenal hemorrhage.

--Right kidney/ureter: There is mild-to-moderate right-sided
hydroureteronephrosis secondary to an obstructing 6 mm stone in the
proximal right ureter. There are no significant residual stones in
the left kidney.

--Left kidney/ureter: No hydronephrosis or perinephric hematoma.

--Urinary bladder: Unremarkable.

Stomach/Bowel:

--Stomach/Duodenum: No hiatal hernia or other gastric abnormality.
Normal duodenal course and caliber.

--Small bowel: No dilatation or inflammation.

--Colon: No focal abnormality.

--Appendix: Normal.

Vascular/Lymphatic: Atherosclerotic calcification is present within
the non-aneurysmal abdominal aorta, without hemodynamically
significant stenosis.

--No retroperitoneal lymphadenopathy.

--No mesenteric lymphadenopathy.

--No pelvic or inguinal lymphadenopathy.

Reproductive: The patient is status post prior vasectomy.

Other: No ascites or free air. The abdominal wall is normal.

Musculoskeletal. No acute displaced fractures.
IMPRESSION: Right-sided obstructive uropathy with 6 mm proximal right ureteral
stone causing mild-to-moderate hydroureteronephrosis.

Aortic Atherosclerosis ([id number]).

## 2020-09-22 ENCOUNTER — Other Ambulatory Visit: Payer: Self-pay | Admitting: Internal Medicine

## 2020-10-26 ENCOUNTER — Other Ambulatory Visit: Payer: Self-pay | Admitting: Internal Medicine

## 2021-01-27 ENCOUNTER — Other Ambulatory Visit: Payer: Self-pay | Admitting: Internal Medicine

## 2021-01-27 NOTE — Telephone Encounter (Signed)
We sent the refill for the albuterol inhaler. Pt has not been seen in almost 2 years. Please schedule CPE in the next few months. Thanks

## 2021-01-28 NOTE — Telephone Encounter (Signed)
Called an scheduled appt for 04/07/21

## 2021-04-07 ENCOUNTER — Encounter: Payer: Self-pay | Admitting: Internal Medicine

## 2021-04-07 ENCOUNTER — Other Ambulatory Visit: Payer: Self-pay

## 2021-04-07 ENCOUNTER — Ambulatory Visit (INDEPENDENT_AMBULATORY_CARE_PROVIDER_SITE_OTHER): Payer: PRIVATE HEALTH INSURANCE | Admitting: Internal Medicine

## 2021-04-07 VITALS — BP 132/84 | HR 92 | Temp 98.5°F | Ht 66.25 in | Wt 160.0 lb

## 2021-04-07 DIAGNOSIS — Z0000 Encounter for general adult medical examination without abnormal findings: Secondary | ICD-10-CM | POA: Diagnosis not present

## 2021-04-07 DIAGNOSIS — M7062 Trochanteric bursitis, left hip: Secondary | ICD-10-CM | POA: Diagnosis not present

## 2021-04-07 DIAGNOSIS — K219 Gastro-esophageal reflux disease without esophagitis: Secondary | ICD-10-CM

## 2021-04-07 DIAGNOSIS — M7632 Iliotibial band syndrome, left leg: Secondary | ICD-10-CM | POA: Insufficient documentation

## 2021-04-07 DIAGNOSIS — Z1211 Encounter for screening for malignant neoplasm of colon: Secondary | ICD-10-CM

## 2021-04-07 DIAGNOSIS — L709 Acne, unspecified: Secondary | ICD-10-CM | POA: Insufficient documentation

## 2021-04-07 DIAGNOSIS — L700 Acne vulgaris: Secondary | ICD-10-CM | POA: Diagnosis not present

## 2021-04-07 LAB — COMPREHENSIVE METABOLIC PANEL
ALT: 33 U/L (ref 0–53)
AST: 28 U/L (ref 0–37)
Albumin: 4.6 g/dL (ref 3.5–5.2)
Alkaline Phosphatase: 82 U/L (ref 39–117)
BUN: 12 mg/dL (ref 6–23)
CO2: 34 mEq/L — ABNORMAL HIGH (ref 19–32)
Calcium: 9.6 mg/dL (ref 8.4–10.5)
Chloride: 102 mEq/L (ref 96–112)
Creatinine, Ser: 0.99 mg/dL (ref 0.40–1.50)
GFR: 87.92 mL/min (ref >60.00)
Glucose, Bld: 87 mg/dL (ref 70–99)
Potassium: 3.9 mEq/L (ref 3.5–5.1)
Sodium: 139 mEq/L (ref 135–145)
Total Bilirubin: 0.5 mg/dL (ref 0.2–1.2)
Total Protein: 7.4 g/dL (ref 6.0–8.3)

## 2021-04-07 LAB — CBC
HCT: 43.4 % (ref 39.0–52.0)
Hemoglobin: 14.5 g/dL (ref 13.0–17.0)
MCHC: 33.5 g/dL (ref 30.0–36.0)
MCV: 92.4 fl (ref 78.0–100.0)
Platelets: 204 10*3/uL (ref 150.0–400.0)
RBC: 4.69 Mil/uL (ref 4.22–5.81)
RDW: 12.7 % (ref 11.5–15.5)
WBC: 6.7 10*3/uL (ref 4.0–10.5)

## 2021-04-07 MED ORDER — LORATADINE 10 MG PO TABS: 10.0000 mg | ORAL_TABLET | Freq: Every day | ORAL | 3 refills | Status: AC | PRN

## 2021-04-07 MED ORDER — ALBUTEROL SULFATE HFA 108 (90 BASE) MCG/ACT IN AERS: INHALATION_SPRAY | RESPIRATORY_TRACT | 2 refills | Status: AC

## 2021-04-07 MED ORDER — OMEPRAZOLE 40 MG PO CPDR: 40.0000 mg | DELAYED_RELEASE_CAPSULE | Freq: Every evening | ORAL | 3 refills | Status: DC

## 2021-04-07 MED ORDER — FLUTICASONE PROPIONATE 50 MCG/ACT NA SUSP: 1.0000 | Freq: Every day | NASAL | 11 refills | Status: AC | PRN

## 2021-04-07 MED ORDER — DICLOFENAC SODIUM 1 % EX GEL: 4.0000 g | Freq: Four times a day (QID) | CUTANEOUS | 3 refills | Status: AC | PRN

## 2021-04-07 NOTE — Assessment & Plan Note (Signed)
Will try diclofenac topical---ice/lidocaine also Could consider meloxicam also

## 2021-04-07 NOTE — Assessment & Plan Note (Signed)
Is exercising Recommended COVID bivalent booster Flu vaccine in fall Needs colon cancer screening---he prefers FIT

## 2021-04-07 NOTE — Progress Notes (Signed)
Subjective:    Patient ID: [place], male    DOB: Apr 06, 1970, 51 y.o.   MRN: [id number]  HPI Here for physical  Wonders about ongoing omeprazole use Formerly 40 bid --but now just 40 at bedtime Didn't tolerate missing doses Discussed weaning  Ongoing rash on head Saw derm Doxycycline for about 6 months--didn't clear it but may have helped Has clobetasol but inconsistent and not sure how it helps  Has ongoing left knee problems--past injury than had to stand all the time at work Not really painful--but will "wobble" and give on him Now has more pain in left hip--burning. It will wake him at 4-5 AM Has to sleep on side due to reflux, etc Ibuprofen will help hip and knee--but worsens GERD (or get nosebleed) Had celebrex last year for shoulder---hasn't tried for hip or knee  May have hernia now on left Wants this checked No pain--but he can feel some pushing  Current Outpatient Medications on File Prior to Visit  Medication Sig Dispense Refill   albuterol (VENTOLIN HFA) 108 (90 Base) MCG/ACT inhaler TAKE 2 PUFFS BY MOUTH EVERY 6 HOURS AS NEEDED FOR WHEEZE OR SHORTNESS OF BREATH 6.7 each 0   fluticasone (FLONASE) 50 MCG/ACT nasal spray Place 1 spray into both nostrils daily as needed for allergies.      ibuprofen (ADVIL,MOTRIN) 200 MG tablet Take 400 mg by mouth daily as needed.     loratadine (CLARITIN) 10 MG tablet Take 10 mg by mouth daily as needed for allergies.      Melatonin 3 MG TABS Take 3 mg by mouth at bedtime as needed (sleep).     Multiple Vitamin (MULTIVITAMIN WITH MINERALS) TABS tablet Take 1 tablet by mouth 3 (three) times a week.     omeprazole (PRILOSEC) 40 MG capsule Take 40 mg by mouth every evening.      oxymetazoline (AFRIN) 0.05 % nasal spray Place 1 spray into both nostrils daily as needed (nosebleeds).     No current facility-administered medications on file prior to visit.    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   Qualifier: Diagnosis of  By: Silvio Pate MD, [facility]     Past Surgical History:  Procedure Laterality Date   CHOLECYSTECTOMY N/A 05/30/2017   Procedure: LAPAROSCOPIC CHOLECYSTECTOMY WITH INTRAOPERATIVE CHOLANGIOGRAM;  Surgeon: Florene Glen, MD;  Location: [facility];  Service: General;  Laterality: N/A;   ESOPHAGOGASTRODUODENOSCOPY (EGD) WITH PROPOFOL N/A 03/01/2016   Procedure: ESOPHAGOGASTRODUODENOSCOPY (EGD) WITH PROPOFOL;  Surgeon: Lucilla Lame, MD;  Location: [facility];  Service: Endoscopy;  Laterality: N/A;   EXTRACORPOREAL SHOCK WAVE LITHOTRIPSY Right 02/14/2019   Procedure: EXTRACORPOREAL SHOCK WAVE LITHOTRIPSY (ESWL);  Surgeon: Billey Co, MD;  Location: [facility];  Service: Urology;  Laterality: Right;   HYDROCELE EXCISION     INGUINAL HERNIA REPAIR     TONSILLECTOMY     AGE 69    Family History  Problem Relation Age of Onset   Alzheimer's disease Mother    Colon cancer Paternal Aunt    Throat cancer Paternal Grandfather     Social History   Socioeconomic History  Marital status: Married    Spouse name: Not on file   Number of children: Not on file   Years of education: Not on file   Highest education level: Not on file  Occupational History   Occupation: Pharmacist    Comment: [employer]  Tobacco Use   Smoking status: Never    Passive exposure: Past   Smokeless tobacco: Never  Vaping Use   Vaping Use: Never used  Substance and Sexual Activity   Alcohol use: Yes    Alcohol/week: 0.0 standard drinks    Comment: OCC/SOCIAL   Drug use: No   Sexual activity: Yes    Birth control/protection: None  Other Topics Concern   Not on  file  Social History Narrative   Not on file   Social Determinants of Health   Financial Resource Strain: Not on file  Food Insecurity: Not on file  Transportation Needs: Not on file  Physical Activity: Not on file  Stress: Not on file  Social Connections: Not on file  Intimate Partner Violence: Not on file   Review of Systems  Constitutional:  Negative for fatigue and unexpected weight change.       Working out with resistance work Wears seat belt  HENT:  Positive for tinnitus. Negative for hearing loss and trouble swallowing.        Uses afrin just for nose bleeds Needs another crown--keeps up with dentist  Eyes:  Negative for visual disturbance.       No diplopia or unilateral vision loss  Respiratory:  Negative for cough and chest tightness.        Had lots of SOB with COVID---needed the inhaler a lot (now not needing in the past couple of weeks)  Cardiovascular:  Negative for chest pain, palpitations and leg swelling.  Gastrointestinal:  Negative for blood in stool.       Looser stools since cholecystectomy  Endocrine: Negative for polydipsia and polyuria.  Genitourinary:  Negative for urgency.       Very slight decreased flow No nocturia No sexual problems  Musculoskeletal:  Positive for arthralgias. Negative for joint swelling.  Skin:  Positive for rash.  Allergic/Immunologic: Positive for environmental allergies. Negative for immunocompromised state.       Claritin and flonase work for him  Neurological:  Negative for dizziness, syncope, light-headedness and headaches.  Hematological:  Negative for adenopathy. Does not bruise/bleed easily.  Psychiatric/Behavioral:  Negative for dysphoric mood and sleep disturbance. The patient is not nervous/anxious.       Objective:   Physical Exam Constitutional:      Appearance: Normal appearance.  HENT:     Mouth/Throat:     Pharynx: No oropharyngeal exudate or posterior oropharyngeal erythema.  Cardiovascular:     Rate  and Rhythm: Normal rate and regular rhythm.     Pulses: Normal pulses.     Heart sounds: No murmur heard.   No gallop.  Pulmonary:     Effort: Pulmonary effort is normal.     Breath sounds: Normal breath sounds. No wheezing or rales.  Abdominal:     Palpations: Abdomen is soft.     Tenderness: There is no abdominal tenderness.  Genitourinary:    Comments: Bulge at left inguinal ring--but no clear hernia Musculoskeletal:     Cervical back: Neck supple.     Right lower leg: No edema.     Left lower leg: No edema.     Comments: Fairly normal ROM ---left hip. Tenderness at bursa  Left knee--no sig ligament/meniscus findings  Lymphadenopathy:     Cervical: No cervical adenopathy.  Skin:    Comments: Inflammatory acne type lesions on forehead/vertex/upper trunk  Neurological:     General: No focal deficit present.     Mental Status: He is alert and oriented to person, place, and time.  Psychiatric:        Mood and Affect: Mood normal.        Behavior: Behavior normal.           Assessment & Plan:

## 2021-04-07 NOTE — Assessment & Plan Note (Signed)
Discussed trying to decrease to 20mg  daily and even skip days if symptoms don't worsen

## 2021-04-07 NOTE — Assessment & Plan Note (Signed)
Seeing derm Looks like restarting doxy might be his best bet He will go back to derm soon

## 2021-04-16 ENCOUNTER — Other Ambulatory Visit (INDEPENDENT_AMBULATORY_CARE_PROVIDER_SITE_OTHER): Payer: PRIVATE HEALTH INSURANCE

## 2021-04-16 DIAGNOSIS — Z1211 Encounter for screening for malignant neoplasm of colon: Secondary | ICD-10-CM | POA: Diagnosis not present

## 2021-04-19 LAB — FECAL OCCULT BLOOD, IMMUNOCHEMICAL: Fecal Occult Bld: NEGATIVE

## 2021-12-10 HISTORY — PX: VITRECTOMY: SHX106

## 2022-04-04 ENCOUNTER — Other Ambulatory Visit: Payer: Self-pay | Admitting: Internal Medicine

## 2022-04-13 ENCOUNTER — Encounter: Payer: Self-pay | Admitting: Internal Medicine

## 2022-04-13 ENCOUNTER — Ambulatory Visit (INDEPENDENT_AMBULATORY_CARE_PROVIDER_SITE_OTHER): Payer: Self-pay | Admitting: Internal Medicine

## 2022-04-13 VITALS — BP 118/84 | HR 86 | Temp 98.2°F | Ht 66.5 in | Wt 162.0 lb

## 2022-04-13 DIAGNOSIS — E781 Pure hyperglyceridemia: Secondary | ICD-10-CM | POA: Diagnosis not present

## 2022-04-13 DIAGNOSIS — N400 Enlarged prostate without lower urinary tract symptoms: Secondary | ICD-10-CM | POA: Insufficient documentation

## 2022-04-13 DIAGNOSIS — M7632 Iliotibial band syndrome, left leg: Secondary | ICD-10-CM

## 2022-04-13 DIAGNOSIS — N401 Enlarged prostate with lower urinary tract symptoms: Secondary | ICD-10-CM | POA: Diagnosis not present

## 2022-04-13 DIAGNOSIS — Z1211 Encounter for screening for malignant neoplasm of colon: Secondary | ICD-10-CM

## 2022-04-13 DIAGNOSIS — Z0000 Encounter for general adult medical examination without abnormal findings: Secondary | ICD-10-CM

## 2022-04-13 DIAGNOSIS — J45909 Unspecified asthma, uncomplicated: Secondary | ICD-10-CM | POA: Insufficient documentation

## 2022-04-13 DIAGNOSIS — K219 Gastro-esophageal reflux disease without esophagitis: Secondary | ICD-10-CM

## 2022-04-13 DIAGNOSIS — J4520 Mild intermittent asthma, uncomplicated: Secondary | ICD-10-CM | POA: Diagnosis not present

## 2022-04-13 LAB — COMPREHENSIVE METABOLIC PANEL
ALT: 27 U/L (ref 0–53)
AST: 25 U/L (ref 0–37)
Albumin: 4.1 g/dL (ref 3.5–5.2)
Alkaline Phosphatase: 81 U/L (ref 39–117)
BUN: 11 mg/dL (ref 6–23)
CO2: 29 mEq/L (ref 19–32)
Calcium: 9.7 mg/dL (ref 8.4–10.5)
Chloride: 103 mEq/L (ref 96–112)
Creatinine, Ser: 0.99 mg/dL (ref 0.40–1.50)
GFR: 87.29 mL/min (ref >60.00)
Glucose, Bld: 100 mg/dL — ABNORMAL HIGH (ref 70–99)
Potassium: 3.7 mEq/L (ref 3.5–5.1)
Sodium: 141 mEq/L (ref 135–145)
Total Bilirubin: 0.4 mg/dL (ref 0.2–1.2)
Total Protein: 7.1 g/dL (ref 6.0–8.3)

## 2022-04-13 LAB — LDL CHOLESTEROL, DIRECT: Direct LDL: 130 mg/dL

## 2022-04-13 LAB — CBC
HCT: 45 % (ref 39.0–52.0)
Hemoglobin: 15.2 g/dL (ref 13.0–17.0)
MCHC: 33.8 g/dL (ref 30.0–36.0)
MCV: 92.4 fl (ref 78.0–100.0)
Platelets: 212 10*3/uL (ref 150.0–400.0)
RBC: 4.87 Mil/uL (ref 4.22–5.81)
RDW: 12.4 % (ref 11.5–15.5)
WBC: 6.2 10*3/uL (ref 4.0–10.5)

## 2022-04-13 LAB — LIPID PANEL
Cholesterol: 227 mg/dL — ABNORMAL HIGH (ref 0–200)
HDL: 48.3 mg/dL (ref >39.00)
Total CHOL/HDL Ratio: 5
Triglycerides: 406 mg/dL — ABNORMAL HIGH (ref 0.0–149.0)

## 2022-04-13 MED ORDER — MELOXICAM 15 MG PO TABS: 15.0000 mg | ORAL_TABLET | Freq: Every day | ORAL | 0 refills | Status: AC | PRN

## 2022-04-13 NOTE — Progress Notes (Signed)
Subjective:    Patient ID: [place], male    DOB: 1970-10-07, 52 y.o.   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 16 g 11   ibuprofen (ADVIL,MOTRIN) 200 MG tablet Take 400 mg by mouth daily as needed.     loratadine (CLARITIN) 10 MG tablet Take 1 tablet (10 mg total) by mouth daily as needed for allergies. 90 tablet 3   Melatonin 3 MG TABS Take 3 mg by mouth at bedtime as needed (sleep).     Multiple Vitamin (MULTIVITAMIN WITH MINERALS) TABS tablet Take 1 tablet by mouth 3 (three) times a week.     omeprazole (PRILOSEC) 40 MG capsule TAKE 1 CAPSULE (40 MG TOTAL) BY MOUTH EVERY EVENING. 90 capsule 3   oxymetazoline (AFRIN) 0.05 % nasal spray Place 1 spray into both nostrils daily as needed (nosebleeds).     diclofenac Sodium (VOLTAREN) 1 % GEL Apply 4 g topically 4 (four) times daily as needed. (Patient not taking: Reported on 04/13/2022) 100 g 3   No current facility-administered medications on file prior to visit.    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   Qualifier: Diagnosis of  By: Silvio Pate MD, [facility]     Past Surgical History:  Procedure Laterality Date   CHOLECYSTECTOMY N/A 05/30/2017   Procedure: LAPAROSCOPIC CHOLECYSTECTOMY WITH INTRAOPERATIVE CHOLANGIOGRAM;  Surgeon: Florene Glen, MD;  Location: [facility];  Service: General;  Laterality: N/A;   ESOPHAGOGASTRODUODENOSCOPY (EGD) WITH PROPOFOL N/A 03/01/2016   Procedure: ESOPHAGOGASTRODUODENOSCOPY (EGD) WITH PROPOFOL;  Surgeon: Lucilla Lame, MD;  Location: [facility];  Service: Endoscopy;  Laterality: N/A;   EXTRACORPOREAL SHOCK WAVE LITHOTRIPSY Right 02/14/2019   Procedure: EXTRACORPOREAL SHOCK WAVE LITHOTRIPSY (ESWL);  Surgeon: Billey Co, MD;  Location: [facility];  Service: Urology;  Laterality: Right;   HYDROCELE EXCISION     INGUINAL HERNIA REPAIR     TONSILLECTOMY     AGE 9   VITRECTOMY Right 12/10/2021    Family History  Problem Relation Age of Onset   Alzheimer's disease Mother    Colon cancer Paternal Aunt    Throat cancer Paternal Grandfather     Social History   Socioeconomic History   Marital status: Married    Spouse  name: Not on file   Number of children: Not on file   Years of education: Not on file   Highest education level: Not on file  Occupational History   Occupation: Pharmacist    Comment: [employer]  Tobacco Use   Smoking status: Never    Passive exposure: Past   Smokeless tobacco: Never  Vaping Use   Vaping Use: Never used  Substance and Sexual Activity   Alcohol use: Yes    Alcohol/week: 0.0 standard drinks of alcohol    Comment: OCC/SOCIAL   Drug use: No   Sexual activity: Yes    Birth control/protection: None  Other Topics Concern   Not  on file  Social History Narrative   Not on file   Social Determinants of Health   Financial Resource Strain: Not on file  Food Insecurity: Not on file  Transportation Needs: Not on file  Physical Activity: Not on file  Stress: Not on file  Social Connections: Not on file  Intimate Partner Violence: Not on file   Review of Systems  Constitutional:  Negative for fatigue and unexpected weight change.       Wears seat belt  HENT:  Positive for tinnitus. Negative for hearing loss and trouble swallowing.        Afrin only for nosebleeds Recent filling--under lower wire. And needs a crown  Eyes:  Positive for visual disturbance.  Respiratory:  Negative for cough, chest tightness and shortness of breath.        Rare wheezing---albuterol prn helps  Cardiovascular:  Negative for chest pain, palpitations and leg swelling.  Gastrointestinal:  Negative for blood in stool and constipation.       Heartburn controlled---but can't miss omeprazole  Genitourinary:  Positive for difficulty urinating. Negative for urgency.  Musculoskeletal:  Positive for arthralgias. Negative for back pain and joint swelling.       Some left knee issues --past MCL tear  Skin:        Ongoing acne ---follows with [facility] still ----retin-A on face, clinda on scalp  Allergic/Immunologic: Positive for environmental allergies. Negative for immunocompromised state.       Uses claritin and flonase in season  Neurological:  Negative for dizziness, syncope, light-headedness and headaches.  Hematological:  Negative for adenopathy. Does not bruise/bleed easily.  Psychiatric/Behavioral:  Negative for dysphoric mood. The patient is not nervous/anxious.        Mild sleep issues       Objective:   Physical Exam Constitutional:      Appearance: Normal appearance.  HENT:     Mouth/Throat:     Pharynx: No oropharyngeal exudate or posterior oropharyngeal erythema.  Eyes:     Conjunctiva/sclera: Conjunctivae normal.      Pupils: Pupils are equal, round, and reactive to light.  Cardiovascular:     Rate and Rhythm: Normal rate and regular rhythm.     Pulses: Normal pulses.     Heart sounds:     No gallop.  Pulmonary:     Effort: Pulmonary effort is normal.     Breath sounds: Normal breath sounds. No wheezing or rales.  Abdominal:     Palpations: Abdomen is soft.     Tenderness: There is no abdominal tenderness.  Musculoskeletal:     Cervical back: Neck supple.     Right lower leg: No edema.     Left lower leg: No edema.  Lymphadenopathy:     Cervical: No cervical adenopathy.  Skin:  Findings: No lesion or rash.  Neurological:     General: No focal deficit present.     Mental Status: He is alert and oriented to person, place, and time.  Psychiatric:        Mood and Affect: Mood normal.        Behavior: Behavior normal.            Assessment & Plan:

## 2022-04-13 NOTE — Addendum Note (Signed)
Addended by: Ellamae Sia on: 04/13/2022 12:27 PM   Modules accepted: Orders

## 2022-04-13 NOTE — Assessment & Plan Note (Signed)
Loratadine/flonase Uses the albuterol prn

## 2022-04-13 NOTE — Assessment & Plan Note (Signed)
Will give meloxicam for prn use

## 2022-04-13 NOTE — Assessment & Plan Note (Signed)
Controlled with omeprazole---can't skip doses

## 2022-04-13 NOTE — Assessment & Plan Note (Signed)
Mild but noticeable symptoms Discussed cialis 5 daily (if wanted for mild ED also) vs tamsulosin daily Not ready for either at this point

## 2022-04-13 NOTE — Assessment & Plan Note (Signed)
Healthy Working out more Still prefers FIT Defer PSA till 31 Recommended updated COVID and flu in the fall He will consider shingrix

## 2022-04-24 LAB — FECAL OCCULT BLOOD, IMMUNOCHEMICAL: Fecal Occult Bld: POSITIVE — AB

## 2022-04-26 DIAGNOSIS — R195 Other fecal abnormalities: Secondary | ICD-10-CM

## 2022-04-28 ENCOUNTER — Encounter: Payer: Self-pay | Admitting: *Deleted

## 2022-04-28 ENCOUNTER — Encounter: Payer: Self-pay | Admitting: Internal Medicine

## 2022-04-29 MED ORDER — TADALAFIL 20 MG PO TABS: 10.0000 mg | ORAL_TABLET | ORAL | 11 refills | Status: AC | PRN

## 2022-06-30 ENCOUNTER — Telehealth: Payer: Self-pay

## 2022-07-04 NOTE — Telephone Encounter (Signed)
Patient notified as instructed by telephone and verbalized understanding. Patient stated the move from [facility] to [place] has been a massive challenge. Patient stated that calling GI in [place] is on his list to do things. Patient stated that he is going to call them soon. Patient was encouraged to call them soon because it can take months to get in with a specialist since he is a new patient. Patient agreed with my suggestion and stated that he will make the phone call.

## 2022-07-04 NOTE — Telephone Encounter (Signed)
Patient called in and stated that the office in [facility] is stating that the referral stated that he has GERD. Informed patient that referral that Dr. Alphonsus Sias placed states something different. They are needing the referral to be resent to them with the correct information so he can be scheduled for colonoscopy. Thank you!

## 2022-07-05 ENCOUNTER — Telehealth: Payer: Self-pay | Admitting: Internal Medicine

## 2022-07-05 NOTE — Telephone Encounter (Signed)
Spoke to pt. Our referral says positive iFIT (iFob). He is going to call them back and make sure they do not have him mixed up with another patient. Will advise them to contact us if they are having an issue.

## 2022-07-05 NOTE — Telephone Encounter (Signed)
Left message for pt. Advised him that I printed out the referral and circled the diagnosis of positive iFIT (iFOB) kit and faxed it back to that office. I wonder if notes for the pt's son was sent accidentally as they have the same name. May not know for sure. Hopefully what I sent this time will work.

## 2022-07-05 NOTE — Telephone Encounter (Signed)
Patient contacted the office regarding a referral placed after a colon cancer screening, states the office he was referred to is under the impression that he is to be seen for GERD, while he was told by this office he was referred due to a colon cancer screening he had. Patient says he spoke with Carollee Herter earlier to figure this issue out, and afterwards called the officer in which his referral was sent to and states they still have the wrong information and his appointment is listed as a referral for GERD. Patient would like a call back regarding this whenever possible, please advise [phone], thank you.

## 2022-08-01 LAB — HM COLONOSCOPY

## 2023-04-05 ENCOUNTER — Other Ambulatory Visit: Payer: Self-pay | Admitting: Internal Medicine

## 2023-04-05 NOTE — Telephone Encounter (Signed)
Patient is due for CPE please schedule then forward back to approve refill.  Thanks!

## 2023-04-06 NOTE — Telephone Encounter (Signed)
Called pt and pt stated that he moved to [facility] and he didn't request a refill [facility] keeps sending  in refill request to different doctors
